# Patient Record
Sex: Male | Born: 1965 | Race: White | Hispanic: No | Marital: Single | Smoking: Never smoker
Health system: Southern US, Community
[De-identification: ages and names within clinical notes are randomized; demographics above are authoritative.]

## PROBLEM LIST (undated history)

## (undated) DIAGNOSIS — I10 Essential (primary) hypertension: Secondary | ICD-10-CM

## (undated) DIAGNOSIS — G8929 Other chronic pain: Secondary | ICD-10-CM

## (undated) DIAGNOSIS — E7800 Pure hypercholesterolemia, unspecified: Secondary | ICD-10-CM

## (undated) DIAGNOSIS — E119 Type 2 diabetes mellitus without complications: Secondary | ICD-10-CM

## (undated) DIAGNOSIS — M549 Dorsalgia, unspecified: Secondary | ICD-10-CM

## (undated) HISTORY — PX: OTHER SURGICAL HISTORY: SHX169

---

## 1998-12-27 ENCOUNTER — Ambulatory Visit (HOSPITAL_COMMUNITY): Admission: RE | Admit: 1998-12-27 | Discharge: 1998-12-27 | Payer: Self-pay | Admitting: Specialist

## 1999-04-02 ENCOUNTER — Encounter: Payer: Self-pay | Admitting: Physical Medicine and Rehabilitation

## 1999-04-02 ENCOUNTER — Ambulatory Visit (HOSPITAL_COMMUNITY)
Admission: RE | Admit: 1999-04-02 | Discharge: 1999-04-02 | Payer: Self-pay | Admitting: Physical Medicine and Rehabilitation

## 2001-12-15 ENCOUNTER — Emergency Department (HOSPITAL_COMMUNITY): Admission: EM | Admit: 2001-12-15 | Discharge: 2001-12-15 | Payer: Self-pay | Admitting: Emergency Medicine

## 2003-03-16 ENCOUNTER — Emergency Department (HOSPITAL_COMMUNITY): Admission: EM | Admit: 2003-03-16 | Discharge: 2003-03-16 | Payer: Self-pay | Admitting: Emergency Medicine

## 2003-03-16 ENCOUNTER — Encounter: Payer: Self-pay | Admitting: Emergency Medicine

## 2003-07-09 ENCOUNTER — Emergency Department (HOSPITAL_COMMUNITY): Admission: AD | Admit: 2003-07-09 | Discharge: 2003-07-09 | Payer: Self-pay | Admitting: Family Medicine

## 2004-02-07 ENCOUNTER — Emergency Department (HOSPITAL_COMMUNITY): Admission: EM | Admit: 2004-02-07 | Discharge: 2004-02-07 | Payer: Self-pay | Admitting: Emergency Medicine

## 2004-02-11 ENCOUNTER — Emergency Department (HOSPITAL_COMMUNITY): Admission: EM | Admit: 2004-02-11 | Discharge: 2004-02-11 | Payer: Self-pay | Admitting: Emergency Medicine

## 2004-04-13 ENCOUNTER — Emergency Department (HOSPITAL_COMMUNITY): Admission: EM | Admit: 2004-04-13 | Discharge: 2004-04-13 | Payer: Self-pay | Admitting: Emergency Medicine

## 2005-02-27 ENCOUNTER — Emergency Department (HOSPITAL_COMMUNITY): Admission: EM | Admit: 2005-02-27 | Discharge: 2005-02-27 | Payer: Self-pay | Admitting: Emergency Medicine

## 2005-08-22 ENCOUNTER — Emergency Department (HOSPITAL_COMMUNITY): Admission: EM | Admit: 2005-08-22 | Discharge: 2005-08-22 | Payer: Self-pay | Admitting: Emergency Medicine

## 2005-11-19 ENCOUNTER — Inpatient Hospital Stay (HOSPITAL_COMMUNITY): Admission: RE | Admit: 2005-11-19 | Discharge: 2005-11-21 | Payer: Self-pay | Admitting: Neurosurgery

## 2007-11-22 ENCOUNTER — Encounter: Admission: RE | Admit: 2007-11-22 | Discharge: 2007-11-22 | Payer: Self-pay | Admitting: Neurosurgery

## 2010-09-10 ENCOUNTER — Ambulatory Visit
Admission: RE | Admit: 2010-09-10 | Discharge: 2010-09-10 | Disposition: A | Discharge status: Home / Self Care | Payer: Medicare Other | Source: Ambulatory Visit | Attending: Family Medicine | Admitting: Family Medicine

## 2010-09-10 ENCOUNTER — Other Ambulatory Visit: Payer: Self-pay | Admitting: Family Medicine

## 2010-09-10 MED ORDER — IOHEXOL 300 MG/ML  SOLN
125.0000 mL | Freq: Once | INTRAMUSCULAR | Status: AC | PRN
  Administered 2010-09-10: 125 mL via INTRAVENOUS

## 2010-09-14 ENCOUNTER — Emergency Department (HOSPITAL_COMMUNITY)
Admission: EM | Admit: 2010-09-14 | Discharge: 2010-09-14 | Disposition: A | Discharge status: Home / Self Care | Payer: Medicare Other | Attending: Emergency Medicine | Admitting: Emergency Medicine

## 2010-09-14 DIAGNOSIS — R05 Cough: Secondary | ICD-10-CM | POA: Insufficient documentation

## 2010-09-14 DIAGNOSIS — Z79899 Other long term (current) drug therapy: Secondary | ICD-10-CM | POA: Insufficient documentation

## 2010-09-14 DIAGNOSIS — J3489 Other specified disorders of nose and nasal sinuses: Secondary | ICD-10-CM | POA: Insufficient documentation

## 2010-09-14 DIAGNOSIS — H9209 Otalgia, unspecified ear: Secondary | ICD-10-CM | POA: Insufficient documentation

## 2010-09-14 DIAGNOSIS — I10 Essential (primary) hypertension: Secondary | ICD-10-CM | POA: Insufficient documentation

## 2010-09-14 DIAGNOSIS — H6120 Impacted cerumen, unspecified ear: Secondary | ICD-10-CM | POA: Insufficient documentation

## 2010-09-14 DIAGNOSIS — E119 Type 2 diabetes mellitus without complications: Secondary | ICD-10-CM | POA: Insufficient documentation

## 2010-09-14 DIAGNOSIS — R059 Cough, unspecified: Secondary | ICD-10-CM | POA: Insufficient documentation

## 2010-09-14 DIAGNOSIS — H60399 Other infective otitis externa, unspecified ear: Secondary | ICD-10-CM | POA: Insufficient documentation

## 2010-12-19 NOTE — Op Note (Signed)
NAME:  Brian Parks, Brian Parks             ACCOUNT NO.:  [id number]   MEDICAL RECORD NO.:  [id number]          PATIENT TYPE:  INP   LOCATION:  3017                         FACILITY:  [facility]   PHYSICIAN:  Reinaldo Meeker, M.D. DATE OF BIRTH:  1966/03/24   DATE OF PROCEDURE:  11/19/2005  DATE OF DISCHARGE:                                 OPERATIVE REPORT   PREOPERATIVE DIAGNOSIS:  Herniated disk and degenerative disk disease, L5-  S1.   POSTOPERATIVE DIAGNOSIS:  Herniated disk and degenerative disk disease, L5-  S1.   PROCEDURE:  L5-S1 decompressive laminectomy followed by bilateral  microdiskectomy followed by posterior lumbar interbody fusion with a  NuVasive bony spacer and peak interbody cage followed by nonsegmental L5-S1  transfacet screw fixation followed by posterolateral fusion.   SECONDARY PROCEDURE:  Microdissection, L5-S1 disk and S1 nerve root and  intraoperative EMG monitoring.   SURGEON:  Reinaldo Meeker, M.D.   ASSISTANT:  Kathaleen Maser. Pool, M.D.   DESCRIPTION OF PROCEDURE:  After being placed in the prone position, the  patient's back was shaved, prepped, and draped in the usual sterile fashion.  Localizing fluoroscopy was used prior to incision to identify the  appropriate level.   A midline incision was made above the spinous processes of L4, L5, and S1.  Using Bovie cutting current, the incision was carried down to the spinous  processes.  Subperiosteal dissection was then carried out bilaterally on the  spinous process, lamina, and facet joint.  A self-retaining retractor was  placed for exposure.  Fluoroscopy showed approach at the appropriate level.  Using the Stille rongeur, the spinous process was removed and saved for use  later in the case along with the interspinous ligament.  At this time,  laminotomy was performed bilaterally by removing the inferior one-half of  the L5 lamina and the medial one-third of the dissection and the superior  half of the S1  lamina.  Bone was removed and saved for use later in the  case.  The ligamentum flavum was removed in a piecemeal fashion.  Similar  decompression was then carried out on the patient's right side.  When this  was completed, residual midline structures were removed to complete the  bilateral decompressive laminectomy.   At this time, bilateral microdiskectomy was carried out.  Starting on the  patient's right side, bipolar coagulation was used to identify the annulus  which was then incised and thoroughly cleaned out with pituitary rongeurs  and curettes.  A similar procedure was then carried out on the patient's  left and once again coagulating on the annulus, incising it, and thoroughly  cleaning out the disk space with pituitary rongeurs and curettes.  Great  care was taken to avoid injury to the neural elements, and this was  successfully done.   At this time, posterior lumbar interbody fusion was performed.  Starting on  the patient's right side, scraping was used.  The disk space had been  distracted up to a 10-mm size which was felt to be a good size.  Scraping  was carried out of the endplates followed by  chiseling with a 10 x 9-mm box  chisel.  When this was removed, any residual disk material was removed, and  then the NuVasive bony spacer of a 10 x 9 x 25-mm size was impacted in good  position which was confirmed with fluoroscopy.  A similar procedure was then  carried out on the opposite side; however, a peak interbody cage was used on  this side.  This was filled with autologous bone graft mixed with  Mastergraft putty.  Prior to placing the peak cage on the patient's left  side, autologous bone graft and BMP were placed deep within the interspace.  The peak spacer was then placed without difficulty, and fluoroscopy showed  it to be in good position.   At this time, transfacet screw fixation was carried out.  On the patient's  right side, a drill hole was used to give a  starting point.  A drill over a  guidewire was then passed through the inferior facet of L5 and into the  pedicle of S1.  This was followed under AP and lateral fluoroscopy as well  as direct palpation of the medial aspect of the pedicle.  Intraoperative EMG  monitoring was then used with NeuroVision to confirm no evidence of breech  of the pedicle and none was identified.  Tapping was then carried out  placement of a 35-mm screw.  A similar procedure was then carried out on the  patient's left, once again using a starting point and then following a drill  over the guidewire.  Once again, AP and lateral fluoroscopy as well as  direct palpation and intraoperative EMG monitoring were used.  Once again, a  35-mm screw was used after tapping had been done.  Final fluoroscopy in AP  and lateral plane showed the screws and bony spacers to all be in good  position.  Large amounts of irrigation were carried out.  Decortication was  carried out of the facet joints bilaterally, and BMP and Mastergraft and  bone were used to do a posterior facet fusion.  Gelfoam was then placed over  the dura and any bleeding controlled with bipolar coagulation and Gelfoam.  A Hemovac drain was used and brought through a separate stab wound incision  and left in the epidural space.  The wound was then closed with multiple  layers of Vicryl in the muscle, fascia, subcutaneous and subcuticular  tissues.  Staples were placed on the skin.  A sterile dressing was then  applied.   The patient was extubated and taken to the recovery room in stable  condition.           ______________________________  Reinaldo Meeker, M.D.     ROK/MEDQ  D:  11/19/2005  T:  11/20/2005  Job:  [id number]

## 2013-03-13 ENCOUNTER — Emergency Department (HOSPITAL_COMMUNITY): Payer: Medicare Other

## 2013-03-13 ENCOUNTER — Encounter (HOSPITAL_COMMUNITY): Payer: Self-pay | Admitting: *Deleted

## 2013-03-13 ENCOUNTER — Inpatient Hospital Stay (HOSPITAL_COMMUNITY)
Admission: EM | Admit: 2013-03-13 | Discharge: 2013-03-19 | DRG: 299 | Disposition: A | Discharge status: Home / Self Care | Payer: Medicare Other | Attending: Internal Medicine | Admitting: Internal Medicine

## 2013-03-13 ENCOUNTER — Ambulatory Visit
Admission: RE | Admit: 2013-03-13 | Discharge: 2013-03-13 | Disposition: A | Discharge status: Home / Self Care | Payer: Medicare Other | Source: Ambulatory Visit | Attending: Family Medicine | Admitting: Family Medicine

## 2013-03-13 ENCOUNTER — Other Ambulatory Visit: Payer: Self-pay | Admitting: Family Medicine

## 2013-03-13 DIAGNOSIS — D649 Anemia, unspecified: Secondary | ICD-10-CM

## 2013-03-13 DIAGNOSIS — I2692 Saddle embolus of pulmonary artery without acute cor pulmonale: Secondary | ICD-10-CM | POA: Diagnosis present

## 2013-03-13 DIAGNOSIS — F1120 Opioid dependence, uncomplicated: Secondary | ICD-10-CM

## 2013-03-13 DIAGNOSIS — E871 Hypo-osmolality and hyponatremia: Secondary | ICD-10-CM | POA: Diagnosis not present

## 2013-03-13 DIAGNOSIS — I2699 Other pulmonary embolism without acute cor pulmonale: Secondary | ICD-10-CM

## 2013-03-13 DIAGNOSIS — K5900 Constipation, unspecified: Secondary | ICD-10-CM

## 2013-03-13 DIAGNOSIS — R0609 Other forms of dyspnea: Secondary | ICD-10-CM | POA: Diagnosis present

## 2013-03-13 DIAGNOSIS — I82401 Acute embolism and thrombosis of unspecified deep veins of right lower extremity: Secondary | ICD-10-CM

## 2013-03-13 DIAGNOSIS — Z794 Long term (current) use of insulin: Secondary | ICD-10-CM

## 2013-03-13 DIAGNOSIS — E119 Type 2 diabetes mellitus without complications: Secondary | ICD-10-CM | POA: Diagnosis present

## 2013-03-13 DIAGNOSIS — R609 Edema, unspecified: Secondary | ICD-10-CM

## 2013-03-13 DIAGNOSIS — I2789 Other specified pulmonary heart diseases: Secondary | ICD-10-CM | POA: Diagnosis present

## 2013-03-13 DIAGNOSIS — F1920 Other psychoactive substance dependence, uncomplicated: Secondary | ICD-10-CM | POA: Diagnosis present

## 2013-03-13 DIAGNOSIS — I82402 Acute embolism and thrombosis of unspecified deep veins of left lower extremity: Secondary | ICD-10-CM

## 2013-03-13 DIAGNOSIS — G8929 Other chronic pain: Secondary | ICD-10-CM | POA: Diagnosis present

## 2013-03-13 DIAGNOSIS — I10 Essential (primary) hypertension: Secondary | ICD-10-CM | POA: Diagnosis present

## 2013-03-13 DIAGNOSIS — IMO0001 Reserved for inherently not codable concepts without codable children: Secondary | ICD-10-CM

## 2013-03-13 DIAGNOSIS — M549 Dorsalgia, unspecified: Secondary | ICD-10-CM | POA: Diagnosis present

## 2013-03-13 DIAGNOSIS — I82409 Acute embolism and thrombosis of unspecified deep veins of unspecified lower extremity: Principal | ICD-10-CM

## 2013-03-13 DIAGNOSIS — IMO0002 Reserved for concepts with insufficient information to code with codable children: Secondary | ICD-10-CM

## 2013-03-13 DIAGNOSIS — R0989 Other specified symptoms and signs involving the circulatory and respiratory systems: Secondary | ICD-10-CM | POA: Diagnosis present

## 2013-03-13 DIAGNOSIS — E118 Type 2 diabetes mellitus with unspecified complications: Secondary | ICD-10-CM

## 2013-03-13 DIAGNOSIS — R000 Tachycardia, unspecified: Secondary | ICD-10-CM | POA: Diagnosis present

## 2013-03-13 DIAGNOSIS — E1165 Type 2 diabetes mellitus with hyperglycemia: Secondary | ICD-10-CM

## 2013-03-13 DIAGNOSIS — D696 Thrombocytopenia, unspecified: Secondary | ICD-10-CM | POA: Diagnosis present

## 2013-03-13 DIAGNOSIS — E7800 Pure hypercholesterolemia, unspecified: Secondary | ICD-10-CM | POA: Diagnosis present

## 2013-03-13 HISTORY — DX: Pure hypercholesterolemia, unspecified: E78.00

## 2013-03-13 HISTORY — DX: Essential (primary) hypertension: I10

## 2013-03-13 HISTORY — DX: Other chronic pain: G89.29

## 2013-03-13 HISTORY — DX: Type 2 diabetes mellitus without complications: E11.9

## 2013-03-13 HISTORY — DX: Dorsalgia, unspecified: M54.9

## 2013-03-13 LAB — POCT I-STAT, CHEM 8
Hemoglobin: 15 g/dL (ref 13.0–17.0)
Sodium: 136 mEq/L (ref 135–145)
TCO2: 19 mmol/L (ref 0–100)

## 2013-03-13 LAB — GLUCOSE, CAPILLARY: Glucose-Capillary: 182 mg/dL — ABNORMAL HIGH (ref 70–99)

## 2013-03-13 LAB — ANTITHROMBIN III: AntiThromb III Func: 106 % (ref 75–120)

## 2013-03-13 LAB — HOMOCYSTEINE: Homocysteine: 15.4 umol/L (ref 4.0–15.4)

## 2013-03-13 LAB — CBC
Platelets: 129 10*3/uL — ABNORMAL LOW (ref 150–400)
RDW: 13.3 % (ref 11.5–15.5)
WBC: 9.6 10*3/uL (ref 4.0–10.5)

## 2013-03-13 LAB — PROTIME-INR: INR: 1.11 (ref 0.00–1.49)

## 2013-03-13 LAB — MRSA PCR SCREENING: MRSA by PCR: NEGATIVE

## 2013-03-13 MED ORDER — IOHEXOL 350 MG/ML SOLN
100.0000 mL | Freq: Once | INTRAVENOUS | Status: AC | PRN
  Administered 2013-03-13: 100 mL via INTRAVENOUS

## 2013-03-13 MED ORDER — OXYCODONE HCL ER 40 MG PO T12A
40.0000 mg | EXTENDED_RELEASE_TABLET | Freq: Two times a day (BID) | ORAL | Status: DC
  Administered 2013-03-13 – 2013-03-19 (×12): 40 mg via ORAL
  Filled 2013-03-13 (×12): qty 1

## 2013-03-13 MED ORDER — SODIUM CHLORIDE 0.9 % IV SOLN: 250.0000 mL | INTRAVENOUS | Status: DC | PRN

## 2013-03-13 MED ORDER — INSULIN ASPART 100 UNIT/ML ~~LOC~~ SOLN
0.0000 [IU] | SUBCUTANEOUS | Status: DC
  Administered 2013-03-13: 7 [IU] via SUBCUTANEOUS
  Administered 2013-03-13: 4 [IU] via SUBCUTANEOUS
  Administered 2013-03-14: 7 [IU] via SUBCUTANEOUS
  Administered 2013-03-14 (×3): 4 [IU] via SUBCUTANEOUS
  Administered 2013-03-14 (×3): 7 [IU] via SUBCUTANEOUS
  Administered 2013-03-15: 4 [IU] via SUBCUTANEOUS
  Administered 2013-03-15: 3 [IU] via SUBCUTANEOUS
  Administered 2013-03-15: 4 [IU] via SUBCUTANEOUS
  Administered 2013-03-15 (×2): 7 [IU] via SUBCUTANEOUS
  Administered 2013-03-16: 4 [IU] via SUBCUTANEOUS
  Administered 2013-03-16: 7 [IU] via SUBCUTANEOUS
  Filled 2013-03-13: qty 1

## 2013-03-13 MED ORDER — ASPIRIN 81 MG PO CHEW
324.0000 mg | CHEWABLE_TABLET | ORAL | Status: AC
  Administered 2013-03-13: 324 mg via ORAL
  Filled 2013-03-13: qty 4

## 2013-03-13 MED ORDER — HEPARIN BOLUS VIA INFUSION
5700.0000 [IU] | Freq: Once | INTRAVENOUS | Status: AC
  Administered 2013-03-13: 5700 [IU] via INTRAVENOUS

## 2013-03-13 MED ORDER — FENOFIBRATE 160 MG PO TABS
160.0000 mg | ORAL_TABLET | Freq: Every day | ORAL | Status: DC
  Administered 2013-03-13 – 2013-03-18 (×6): 160 mg via ORAL
  Filled 2013-03-13 (×8): qty 1

## 2013-03-13 MED ORDER — ASPIRIN 300 MG RE SUPP: 300.0000 mg | RECTAL | Status: AC

## 2013-03-13 MED ORDER — SODIUM CHLORIDE 0.9 % IV SOLN
INTRAVENOUS | Status: DC
  Administered 2013-03-13 – 2013-03-15 (×4): via INTRAVENOUS

## 2013-03-13 MED ORDER — SODIUM CHLORIDE 0.9 % IV SOLN
Freq: Once | INTRAVENOUS | Status: AC
  Administered 2013-03-13: 17:00:00 via INTRAVENOUS

## 2013-03-13 MED ORDER — HEPARIN (PORCINE) IN NACL 100-0.45 UNIT/ML-% IJ SOLN
2000.0000 [IU]/h | INTRAMUSCULAR | Status: DC
  Administered 2013-03-13: 1800 [IU]/h via INTRAVENOUS
  Administered 2013-03-14: 2000 [IU]/h via INTRAVENOUS
  Filled 2013-03-13 (×4): qty 250

## 2013-03-13 MED ORDER — COLESEVELAM HCL 625 MG PO TABS
1875.0000 mg | ORAL_TABLET | Freq: Two times a day (BID) | ORAL | Status: DC
  Administered 2013-03-14 – 2013-03-19 (×11): 1875 mg via ORAL
  Filled 2013-03-13 (×13): qty 3

## 2013-03-13 NOTE — ED Provider Notes (Signed)
Medical screening examination/treatment/procedure(s) were performed by non-physician practitioner and as supervising physician I was immediately available for consultation/collaboration.  Juliet Rude. Rubin Payor, MD 03/13/13 1616

## 2013-03-13 NOTE — ED Notes (Signed)
Attempt to call report x 1  

## 2013-03-13 NOTE — ED Notes (Signed)
Pt eating dinner

## 2013-03-13 NOTE — ED Notes (Signed)
Meal tray ordered for patient.

## 2013-03-13 NOTE — Progress Notes (Signed)
ANTICOAGULATION CONSULT NOTE - Initial Consult  Pharmacy Consult for Heparin Indication: pulmonary embolus and DVT  Allergies  Allergen Reactions  . Lipitor (Atorvastatin) Other (See Comments)    Fatigue  . Losartan Nausea And Vomiting, Other (See Comments) and Cough    Dizziness   . Lyrica (Pregabalin) Other (See Comments)    blindness  . Prednisone Swelling    Legs swell    Patient Measurements: 76 in Weight: 275 lb (124.739 kg) Heparin Dosing Weight: 113 kg  Vital Signs: Temp: 98.6 F (37 C) (08/11 1350) Temp src: Oral (08/11 1350) BP: 140/90 mmHg (08/11 1515) Pulse Rate: 106 (08/11 1515)  Labs:  Recent Labs  03/13/13 1340 03/13/13 1409  HGB 14.6 15.0  HCT 40.8 44.0  PLT 129*  --   LABPROT 14.1  --   INR 1.11  --   CREATININE  --  1.10    CrCl is unknown because there is no height on file for the current visit.   Medical History: Past Medical History  Diagnosis Date  . Hypertension   . Hypercholesteremia   . Diabetes mellitus without complication   . Chronic back pain     Assessment: 47 y/o M with + outpatient doppler for DVT who comes to ED today for CT angio which is + for saddle PE. To start heparin per pharmacy. No bleeding issues per patient. Renal function good, H/H good, PLTS 129, baseline INR is 1.11, aPTT is ordered and lab is in room drawing.   Goal of Therapy:  Heparin level 0.3-0.7 units/ml Monitor platelets by anticoagulation protocol: Yes   Plan:  -Heparin 5700 units BOLUS x 1 -Start heparin infusion at 1800 units/hr -Check 6 hour HL at 2300 -Daily CBC/HL -Watch for bleeding  Thank you for allowing me to take part in this patient's care,  Abran Duke, PharmD Clinical Pharmacist Phone: [phone] Pager: [phone] 03/13/2013 4:45 PM

## 2013-03-13 NOTE — ED Provider Notes (Signed)
CSN: [id number]     Arrival date & time 03/13/13  1310 History     First MD Initiated Contact with Patient 03/13/13 1340     No chief complaint on file.  (Consider location/radiation/quality/duration/timing/severity/associated sxs/prior Treatment) HPI Comments: Patient is a 47 oral melena history of hypertension, hypercholesterolemia, and diabetes mellitus who presents after having venous duplex of LLE positive for DVT. Patient states the symptoms began one week ago as dyspnea on exertion. He states that he began to get winded, even walking to his mailbox. And dyspnea on exertion resolved over the course of a few days; however was followed by dry, nonproductive cough, lightheadedness, and near syncope as well as intermittent claudication which began 2 days ago and swelling of his LLE with onset yesterday. Patient went to see PCP, Dr. Suann Larry, this AM who ordered outpatient venous duplex. Findings significant for thrombus beginning at the level of the mid superficial femoral vein and extending distally through the popliteal vein into the posterior tibial vein. Patient denies fever, hemoptysis, CP, numbness/tingling, and extremity weakness. Denies hx of recent surgeries or hospitalizations as well as coagulation disorders.  The history is provided by the patient. No language interpreter was used.    Past Medical History  Diagnosis Date  . Hypertension   . Hypercholesteremia   . Diabetes mellitus without complication   . Chronic back pain    Past Surgical History  Procedure Laterality Date  . Nerve stimilator     No family history on file. History  Substance Use Topics  . Smoking status: Never Smoker   . Smokeless tobacco: Not on file  . Alcohol Use: No    Review of Systems  Constitutional: Negative for fever.  Eyes: Negative for visual disturbance.  Respiratory: Positive for cough (dry, nonproductive) and shortness of breath.   Cardiovascular: Positive for leg swelling.   Gastrointestinal: Positive for nausea and vomiting (NB/NB).  Skin: Negative for color change and pallor.  Neurological: Positive for light-headedness. Negative for syncope and weakness.  All other systems reviewed and are negative.   Allergies  Lipitor; Losartan; Lyrica; and Prednisone  Home Medications   Current Outpatient Rx  Name  Route  Sig  Dispense  Refill  . colesevelam (WELCHOL) 625 MG tablet   Oral   Take 1,875 mg by mouth 2 (two) times daily with a meal.         . fenofibrate 160 MG tablet   Oral   Take 160 mg by mouth at bedtime.         Marland Kitchen glimepiride (AMARYL) 2 MG tablet   Oral   Take 2 mg by mouth 2 (two) times daily.         . insulin lispro protamine-lispro (HUMALOG 50/50) (50-50) 100 UNIT/ML SUSP injection   Subcutaneous   Inject 20 Units into the skin 3 (three) times daily.         Marland Kitchen linagliptin (TRADJENTA) 5 MG TABS tablet   Oral   Take 5 mg by mouth daily.         Marland Kitchen losartan (COZAAR) 100 MG tablet   Oral   Take 100 mg by mouth daily.         . metFORMIN (GLUCOPHAGE) 1000 MG tablet   Oral   Take 1,000 mg by mouth 2 (two) times daily with a meal.         . OMEGA-3 KRILL OIL PO   Oral   Take 1 capsule by mouth every morning.         Marland Kitchen  oxyCODONE (OXYCONTIN) 40 MG 12 hr tablet   Oral   Take 40 mg by mouth every 8 (eight) hours.          BP 147/86  Pulse 109  Temp(Src) 98.6 F (37 C) (Oral)  Resp 17  SpO2 96%  Physical Exam  Constitutional: He is oriented to person, place, and time. He appears well-developed and well-nourished. No distress.  HENT:  Head: Normocephalic and atraumatic.  Mouth/Throat: Oropharynx is clear and moist.  Eyes: Conjunctivae and EOM are normal. Pupils are equal, round, and reactive to light. No scleral icterus.  Neck: Normal range of motion. Neck supple.  Cardiovascular: Normal rate, regular rhythm, normal heart sounds and intact distal pulses.   Tachycardic to 110. 2+ DP and PT pulses b/l   Pulmonary/Chest: Effort normal and breath sounds normal. No respiratory distress. He has no wheezes. He has no rales.  No dyspnea, tachypnea, or accessory muscle use  Abdominal: Soft. He exhibits no distension. There is no tenderness.  Musculoskeletal:  +swelling of L foot and ankle  Neurological: He is alert and oriented to person, place, and time.  Speaks in full, goal oriented sentences. Moves extremities without ataxia  Skin: Skin is warm and dry. No rash noted. He is not diaphoretic. No erythema. No pallor.  Psychiatric: He has a normal mood and affect. His behavior is normal.   ED Course   Procedures (including critical care time)  Labs Reviewed  CBC - Abnormal; Notable for the following:    Platelets 129 (*)    All other components within normal limits  POCT I-STAT, CHEM 8 - Abnormal; Notable for the following:    Glucose, Bld 292 (*)    All other components within normal limits  PROTIME-INR  ANTITHROMBIN III  PROTEIN C ACTIVITY  PROTEIN C, TOTAL  PROTEIN S ACTIVITY  PROTEIN S, TOTAL  LUPUS ANTICOAGULANT PANEL  BETA-2-GLYCOPROTEIN I ABS, IGG/M/A  HOMOCYSTEINE  FACTOR 5 LEIDEN  PROTHROMBIN GENE MUTATION  CARDIOLIPIN ANTIBODIES, IGG, IGM, IGA   US Venous Img Lower Unilateral Left  03/13/2013   *RADIOLOGY REPORT*  Clinical Data: But the left lower extremity swelling and pain  BILATERAL LOWER EXTREMITY VENOUS DUPLEX ULTRASOUND  Technique:  Gray-scale sonography with graded compression, as well as color Doppler and duplex ultrasound, were performed to evaluate the deep venous system of both lower extremities from the level of the common femoral vein through the popliteal and proximal calf veins.  Spectral Doppler was utilized to evaluate flow at rest and with distal augmentation maneuvers.  Comparison:  None.  Findings:  There is a visualized thrombus beginning at the level of the mid superficial femoral vein and extending distally through the popliteal vein into the posterior  tibial vein.  These portions of the deep venous system show absence of a normal phasicity and compressibility.  Common femoral vein, saphenous femoral junction, profunda femoral vein, and more proximal superficial femoral vein appear normal.  IMPRESSION: There is evidence of acute deep venous thrombosis in the left lower extremity as described above.I gave this report to Dr. Chinita Greenland by phone at  12:46.   Original Report Authenticated By: Esperanza Heir, M.D.    Date: 03/13/2013  Rate: 109  Rhythm: sinus tachycardia  QRS Axis: normal  Intervals: normal  ST/T Wave abnormalities: normal  Conduction Disutrbances:none  Narrative Interpretation: Sinus tachycardia; no STEMI or ischemic changes  Old EKG Reviewed: none available I have personally reviewed and interpreted this EKG  No diagnosis found.  MDM  Patient  with positive venous duplex today, done as outpatient. Here for further work up in light of recent DOE, lightheadedness, and near syncope a few days ago. Patient tachycardic on arrival to 110. No tachypnea, dyspnea, or hypoxia; lungs CTAB. Hypercoagulable panel ordered as patient does not have hx of coagulopathies. PT/INR normal. CBC significant for mild thrombocytopenia of 129. Kidney function normal. CT angio chest ordered to evaluate for PE.  3:45 - CT in process. Patient signed out to oncoming provider, Dr. Alvira Monday, for further evaluation and dispo.  Filed Vitals:   03/13/13 1314 03/13/13 1350 03/13/13 1500 03/13/13 1515  BP: 155/102 147/86 135/60 140/90  Pulse: 111 109 107 106  Temp: 98.2 F (36.8 C) 98.6 F (37 C)    TempSrc: Oral Oral    Resp: 18 17 18 18   SpO2: 98% 96% 92% 95%      Antony Madura, PA-C 03/13/13 1551

## 2013-03-13 NOTE — H&P (Signed)
PULMONARY  / CRITICAL CARE MEDICINE  Name: Brian Parks MRN: [id number] DOB: 1966-03-24    ADMISSION DATE:  03/13/2013  REFERRING MD :  EDP PRIMARY SERVICE: PCCM  CHIEF COMPLAINT:  PE  BRIEF PATIENT DESCRIPTION: 47 y/o M who presented to [facility] ER 8/11 with known positive DVT per PCP.  CT eval with large saddle emboli.    SIGNIFICANT EVENTS / STUDIES:  outpt duplex pos left lower exr 8/11 - admit with PE / DVT  LINES / TUBES:  CULTURES:  ANTIBIOTICS:  HISTORY OF PRESENT ILLNESS:  47 y/o M with PMH of HTN, HLD, DM, chronic back pain s/p nerve stimulator who presented to [facility] ER 8/11 from PCP (Dr. Suann Larry) with positive LE DVT - reported LE swelling and claudication.  Patient notes he has been having 1 wk hx of dyspnea on exertion that was initially intermittent.  Notes dry cough, lightheadedness, and near syncope.  Denies fever, chills, chest pain, chest pain with inspiration, hemoptysis. Evaluation in ER demonstrated CT with large saddle emboli.  Reports mother with history of lupus.   PAST MEDICAL HISTORY :  Past Medical History  Diagnosis Date  . Hypertension   . Hypercholesteremia   . Diabetes mellitus without complication   . Chronic back pain    Past Surgical History  Procedure Laterality Date  . Nerve stimilator     Prior to Admission medications   Medication Sig Start Date End Date Taking? Authorizing Provider  colesevelam (WELCHOL) 625 MG tablet Take 1,875 mg by mouth 2 (two) times daily with a meal.   Yes Historical Provider, MD  fenofibrate 160 MG tablet Take 160 mg by mouth at bedtime.   Yes Historical Provider, MD  glimepiride (AMARYL) 2 MG tablet Take 2 mg by mouth 2 (two) times daily.   Yes Historical Provider, MD  insulin lispro protamine-lispro (HUMALOG 50/50) (50-50) 100 UNIT/ML SUSP injection Inject 20 Units into the skin 3 (three) times daily.   Yes Historical Provider, MD  linagliptin (TRADJENTA) 5 MG TABS tablet Take 5 mg by mouth daily.   Yes Historical  Provider, MD  losartan (COZAAR) 100 MG tablet Take 100 mg by mouth daily.   Yes Historical Provider, MD  metFORMIN (GLUCOPHAGE) 1000 MG tablet Take 1,000 mg by mouth 2 (two) times daily with a meal.   Yes Historical Provider, MD  OMEGA-3 KRILL OIL PO Take 1 capsule by mouth every morning.   Yes Historical Provider, MD  oxyCODONE (OXYCONTIN) 40 MG 12 hr tablet Take 40 mg by mouth every 8 (eight) hours.   Yes Historical Provider, MD   Allergies  Allergen Reactions  . Lipitor (Atorvastatin) Other (See Comments)    Fatigue  . Losartan Nausea And Vomiting, Other (See Comments) and Cough    Dizziness   . Lyrica (Pregabalin) Other (See Comments)    blindness  . Prednisone Swelling    Legs swell    FAMILY HISTORY:  Mom - lupus, dvt, unknown age Brother- high chol, dm, htn  SOCIAL HISTORY:  reports that he has never smoked. He does not have any smokeless tobacco history on file. He reports that he does not drink alcohol or use illicit drugs.  REVIEW OF SYSTEMS:   Gen: Denies fever, chills, weight change, fatigue, night sweats HEENT: Denies blurred vision, double vision, hearing loss, tinnitus, sinus congestion, rhinorrhea, sore throat, neck stiffness, dysphagia PULM: Denies sputum production, hemoptysis, wheezing.  SEE HPI.   CV: Denies chest pain, edema, orthopnea, paroxysmal nocturnal dyspnea, palpitations GI:  Denies abdominal pain, nausea, vomiting, diarrhea, hematochezia, melena, constipation, change in bowel habits GU: Denies dysuria, hematuria, polyuria, oliguria, urethral discharge Endocrine: Denies hot or cold intolerance, polyuria, polyphagia or appetite change Derm: Denies rash, dry skin, scaling or peeling skin change Heme: Denies easy bruising, bleeding, bleeding gums Neuro: Denies headache, numbness, weakness, slurred speech, loss of memory or consciousness   SUBJECTIVE:   VITAL SIGNS: Temp:  [98.2 F (36.8 C)-98.6 F (37 C)] 98.6 F (37 C) (08/11 1350) Pulse Rate:   [106-111] 106 (08/11 1515) Resp:  [17-18] 18 (08/11 1515) BP: (135-155)/(60-102) 140/90 mmHg (08/11 1515) SpO2:  [92 %-98 %] 95 % (08/11 1515) Weight:  [275 lb (124.739 kg)] 275 lb (124.739 kg) (08/11 1632)  INTAKE / OUTPUT: Intake/Output   None     PHYSICAL EXAMINATION: General:  No distress Neuro:  Awake,nonfocal HEENT:  jvd wnl Cardiovascular:  s1 s2 rrr no r/g Lungs:  CTA Abdomen:  Soft, obee, NT, nd no r /g Musculoskeletal:  Edema, dense all left lower ext   LABS:  CBC Recent Labs     03/13/13  1340  03/13/13  1409  WBC  9.6   --   HGB  14.6  15.0  HCT  40.8  44.0  PLT  129*   --    Coag's Recent Labs     03/13/13  1340  INR  1.11   BMET Recent Labs     03/13/13  1409  NA  136  K  3.9  CL  104  BUN  18  CREATININE  1.10  GLUCOSE  292*   Electrolytes No results found for this basename: CALCIUM, MG, PHOS,  in the last 72 hours Sepsis Markers No results found for this basename: LACTICACIDVEN, PROCALCITON, O2SATVEN,  in the last 72 hours ABG No results found for this basename: PHART, PCO2ART, PO2ART,  in the last 72 hours Liver Enzymes No results found for this basename: AST, ALT, ALKPHOS, BILITOT, ALBUMIN,  in the last 72 hours Cardiac Enzymes No results found for this basename: TROPONINI, PROBNP,  in the last 72 hours Glucose No results found for this basename: GLUCAP,  in the last 72 hours  Imaging Ct Angio Chest W/cm &/or Wo Cm  03/13/2013   *RADIOLOGY REPORT*  Clinical Data: History of lower extremity blood clot, now with shortness of breath, dizziness and cough, evaluate for pulmonary embolism  CT ANGIOGRAPHY CHEST  Technique:  Multidetector CT imaging of the chest using the standard protocol during bolus administration of intravenous contrast. Multiplanar reconstructed images including MIPs were obtained and reviewed to evaluate the vascular anatomy.  Contrast: OMNIPAQUE IOHEXOL 350 MG/ML SOLN  Comparison: Bilateral lower extremity venous  Doppler ultrasound - earlier same day  Vascular Findings:  There is adequate opacification of the pulmonary arterial system of the main pulmonary artery measuring 299 HU.  There is saddle pulmonary emboli noted extending across the bifurcation of the main pulmonary artery with nonocclusive thrombus seen within both the right and left pulmonary arteries.  There is extension of mixed occlusive and nonocclusive thrombus into nearly all bilateral segmental pulmonary arteries, likely worse within the bilateral lower lobar pulmonary arteries, left greater than right.  There is mild enlargement the caliber the main pulmonary artery measuring approximately 36 mm in diameter.  There is no definitive evidence of right-sided heart strain.  There is no reflux of administered intravenous contrast into the hepatic venous system. There is no definitive evidence of pulmonary infarction.  Normal heart size.  Coronary artery calcifications.  No pericardial effusion.  Normal caliber of the thoracic aorta.  Bovine configuration of the aortic arch.  No thoracic aortic dissection or periaortic stranding.  -------------------------------------------------------  Nonvascular findings:  Minimal bibasilar dependent ground-glass atelectasis.  No focal airspace opacities.  No definitive evidence of pulmonary infarction.  The central pulmonary airways are patent.  No pleural effusion or pneumothorax.  Scattered shoddy mediastinal lymph nodes are not enlarged by CT criteria.  No mediastinal, hilar or axillary lymphadenopathy.  Limited visualization of the upper abdomen suggests possible decreased attenuation of the hepatic parenchyma suggestive of hepatic steatosis.  No acute or aggressive osseous abnormalities.  A spinal stimulator is seen within the spinal canal of the lower thoracic spine.  IMPRESSION: 1.  Examination is positive for saddle pulmonary embolism with large bilateral occlusive or nonocclusive clot burden.  No definitive evidence  of pulmonary infarction.  While there is enlargement of the caliber of the main pulmonary artery, there is no definite CT evidence of right-sided heart strain.  Further evaluation with cardiac echo may be performed as clinically indicated. 2.  Possible hepatic steatosis.  Above findings discussed with Dr. Rubin Payor at [phone].   Original Report Authenticated By: Tacey Ruiz, MD   US Venous Img Lower Unilateral Left  03/13/2013   *RADIOLOGY REPORT*  Clinical Data: But the left lower extremity swelling and pain  BILATERAL LOWER EXTREMITY VENOUS DUPLEX ULTRASOUND  Technique:  Gray-scale sonography with graded compression, as well as color Doppler and duplex ultrasound, were performed to evaluate the deep venous system of both lower extremities from the level of the common femoral vein through the popliteal and proximal calf veins.  Spectral Doppler was utilized to evaluate flow at rest and with distal augmentation maneuvers.  Comparison:  None.  Findings:  There is a visualized thrombus beginning at the level of the mid superficial femoral vein and extending distally through the popliteal vein into the posterior tibial vein.  These portions of the deep venous system show absence of a normal phasicity and compressibility.  Common femoral vein, saphenous femoral junction, profunda femoral vein, and more proximal superficial femoral vein appear normal.  IMPRESSION: There is evidence of acute deep venous thrombosis in the left lower extremity as described above.I gave this report to Dr. Chinita Greenland by phone at  12:46.   Original Report Authenticated By: Esperanza Heir, M.D.    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) Dyspnea  P:   -stat heparin gtt STAT, aggressive bolus -Would NOT use lovenox -no coumadin etc for now until ensure no tpa needed -assess hypercoag panel prior to heparin. Has pos fam h/o -pcxr in am  -no role filter at this stage -echo  CARDIOVASCULAR A:   Tachycardia - in setting of PE P:  -ICU montior -gentle hydration -continue welchol, fenofibrate -echo. Assess pa htn , likely as he appears so good clinicaly -see heme -need homocysteine level  RENAL A:   P:   -monitor lytes / sr cr  GASTROINTESTINAL A:   P:   -heart healthy, carb modified diet  HEMATOLOGIC A:   PE / DVT P:  -hypercoag panel as above -heparin gtt -keep in ICU until ensure no tpa needed, declines -no ambulation today -need lups anticoag, ana, esr, homocystein, pt 202010 gene mutation as under age 35 still  INFECTIOUS A:  No acute infectious process P:   -monitor fever curve / leukocytosis  ENDOCRINE A:   DM   P:   -  SSI -hold amaryl,  tradjenta, metformin, humalog 50/50  NEUROLOGIC A:   Chronic Back Pain P:   -continue oxy 40 mg Q12 (reduced from home Q8 dosing)   I have personally obtained a history, examined the patient, evaluated laboratory and imaging results, formulated the assessment and plan and placed orders.  CRITICAL CARE: The patient is critically ill with multiple organ systems failure and requires high complexity decision making for assessment and support, frequent evaluation and titration of therapies, application of advanced monitoring technologies and extensive interpretation of multiple databases. Critical Care Time devoted to patient care services described in this note is 30 minutes.    Mcarthur Rossetti. Tyson Alias, MD, FACP Pgr: [phone] [place] [facility]  03/13/2013, 4:43 PM

## 2013-03-13 NOTE — ED Notes (Signed)
PT sent here by pcp for CT to r/o PE after being dx with dvt to L leg on Sunday.  Denies cough and sob at this time, though did experience sob/dizziness last week. PT's pcp is Dr Suann Larry [phone].

## 2013-03-14 ENCOUNTER — Inpatient Hospital Stay (HOSPITAL_COMMUNITY): Payer: Medicare Other

## 2013-03-14 ENCOUNTER — Encounter (HOSPITAL_COMMUNITY): Payer: Self-pay | Admitting: *Deleted

## 2013-03-14 DIAGNOSIS — I369 Nonrheumatic tricuspid valve disorder, unspecified: Secondary | ICD-10-CM

## 2013-03-14 LAB — LUPUS ANTICOAGULANT PANEL
DRVVT: 40 secs (ref <42.9)
PTT Lupus Anticoagulant: 35.6 secs (ref 28.0–43.0)

## 2013-03-14 LAB — HEPARIN LEVEL (UNFRACTIONATED): Heparin Unfractionated: 0.19 IU/mL — ABNORMAL LOW (ref 0.30–0.70)

## 2013-03-14 LAB — BASIC METABOLIC PANEL
BUN: 14 mg/dL (ref 6–23)
Calcium: 8.8 mg/dL (ref 8.4–10.5)
Creatinine, Ser: 0.95 mg/dL (ref 0.50–1.35)
GFR calc Af Amer: >90 mL/min (ref >90)
GFR calc non Af Amer: >90 mL/min (ref >90)
Potassium: 3.9 mEq/L (ref 3.5–5.1)

## 2013-03-14 LAB — CBC
Hemoglobin: 12.7 g/dL — ABNORMAL LOW (ref 13.0–17.0)
MCH: 29.9 pg (ref 26.0–34.0)
MCHC: 34.5 g/dL (ref 30.0–36.0)
Platelets: 124 10*3/uL — ABNORMAL LOW (ref 150–400)
RDW: 13.4 % (ref 11.5–15.5)

## 2013-03-14 LAB — GLUCOSE, CAPILLARY
Glucose-Capillary: 174 mg/dL — ABNORMAL HIGH (ref 70–99)
Glucose-Capillary: 204 mg/dL — ABNORMAL HIGH (ref 70–99)

## 2013-03-14 LAB — FACTOR 5 LEIDEN

## 2013-03-14 LAB — PROTEIN S ACTIVITY: Protein S Activity: 137 % — ABNORMAL HIGH (ref 69–129)

## 2013-03-14 LAB — PROTHROMBIN GENE MUTATION

## 2013-03-14 LAB — PROTEIN C ACTIVITY: Protein C Activity: 200 % — ABNORMAL HIGH (ref 75–133)

## 2013-03-14 MED ORDER — HEPARIN (PORCINE) IN NACL 100-0.45 UNIT/ML-% IJ SOLN
2200.0000 [IU]/h | INTRAMUSCULAR | Status: DC
  Filled 2013-03-14 (×2): qty 250

## 2013-03-14 MED ORDER — HEPARIN BOLUS VIA INFUSION
7500.0000 [IU] | Freq: Once | INTRAVENOUS | Status: AC
  Administered 2013-03-14: 7500 [IU] via INTRAVENOUS
  Filled 2013-03-14: qty 7500

## 2013-03-14 MED ORDER — HEPARIN BOLUS VIA INFUSION
2000.0000 [IU] | Freq: Once | INTRAVENOUS | Status: AC
  Administered 2013-03-14: 2000 [IU] via INTRAVENOUS
  Filled 2013-03-14: qty 2000

## 2013-03-14 MED ORDER — HEPARIN BOLUS VIA INFUSION
2500.0000 [IU] | Freq: Once | INTRAVENOUS | Status: AC
  Administered 2013-03-14: 2500 [IU] via INTRAVENOUS
  Filled 2013-03-14: qty 2500

## 2013-03-14 MED ORDER — HEPARIN BOLUS VIA INFUSION
6000.0000 [IU] | Freq: Once | INTRAVENOUS | Status: AC
  Administered 2013-03-14: 6000 [IU] via INTRAVENOUS
  Filled 2013-03-14: qty 6000

## 2013-03-14 MED ORDER — HEPARIN (PORCINE) IN NACL 100-0.45 UNIT/ML-% IJ SOLN
2600.0000 [IU]/h | INTRAMUSCULAR | Status: DC
  Administered 2013-03-14 – 2013-03-15 (×3): 2600 [IU]/h via INTRAVENOUS
  Filled 2013-03-14 (×5): qty 250

## 2013-03-14 NOTE — Progress Notes (Signed)
Inpatient Diabetes Program Recommendations  AACE/ADA: New Consensus Statement on Inpatient Glycemic Control (2013)  Target Ranges:  Prepandial:   less than 140 mg/dL      Peak postprandial:   less than 180 mg/dL (1-2 hours)      Critically ill patients:  140 - 180 mg/dL   Reason for Assessmentt: Hyperglycemia  Inpatient Diabetes Program Recommendations Insulin - Basal: On no basal insulin Correction (SSI): Receiving resistant scale novolog correction every 4 hours HgbA1C: No A1C noted. Diet: No diet order noted, but plan to start CHO Mod Medium  Note:  Results for STALEY, LUNZ (MRN [id number]) as of 03/14/2013 09:39  Ref. Range 03/13/2013 17:58 03/13/2013 19:14 03/14/2013 00:05 03/14/2013 04:03 03/14/2013 08:32  Glucose-Capillary Latest Range: 70-99 mg/dL 782 (H) 956 (H) 213 (H) 210 (H) 204 (H)   CBG this morning > 200 mg/dl despite receiving correction Novolog every 4 hours during night.  Note plan to start diet which will increase needs even further.  Ability to eat unknown.  May benefit from starting some basal insulin.  Takes 50/50 20 units tid with meals.  May benefit from starting NPH 10 units tid with meals and switch to 50/50-- or substitute NPH 10 units plus Novolog 10 units tid with meals-- as CBG indicate.  Would not recommend giving Amaryl in the hospital setting due to risk of hypoglycemia.  Request MD order a Hgb A1C. Thank you.  Enrigue Hashimi S. Elsie Lincoln, RN, CNS, CDE Inpatient Diabetes Program, team pager [phone]

## 2013-03-14 NOTE — Progress Notes (Signed)
Spoke to Dr. Tyson Alias regarding HL=0.18. Per RN there were no issues with infusion. He wants to re-bolus 12,000 units and increase the drip to 2600 units/hr. Will check another level in 6 hours. Pharmacy is to call elink once the next level results.  Louie Casa, PharmD, BCPS 03/14/2013, 8:25PM

## 2013-03-14 NOTE — Progress Notes (Signed)
  Echocardiogram 2D Echocardiogram has been performed.  Cathie Beams 03/14/2013, 9:49 AM

## 2013-03-14 NOTE — H&P (Addendum)
PULMONARY  / CRITICAL CARE MEDICINE  Name: Brian Parks MRN: [id number] DOB: [birth year]    ADMISSION DATE:  03/13/2013  REFERRING MD :  EDP PRIMARY SERVICE: PCCM  CHIEF COMPLAINT:  PE  BRIEF PATIENT DESCRIPTION: 47 y/o M who presented to [facility] ER 8/11 with known positive DVT per PCP.  CT eval with large saddle emboli.    SIGNIFICANT EVENTS / STUDIES:  outpt duplex pos left lower exr 8/11 - admit with PE / DVT  LINES / TUBES:  CULTURES:  ANTIBIOTICS:  SUBJECTIVE: no events over night  VITAL SIGNS: Temp:  [97.9 F (36.6 C)-98.6 F (37 C)] 97.9 F (36.6 C) (08/12 0404) Pulse Rate:  [90-111] 93 (08/12 0700) Resp:  [12-34] 15 (08/12 0700) BP: (109-155)/(60-103) 109/68 mmHg (08/12 0700) SpO2:  [90 %-98 %] 94 % (08/12 0700) Weight:  [124.7 kg (274 lb 14.6 oz)-124.739 kg (275 lb)] 124.739 kg (275 lb) (08/12 0459)  INTAKE / OUTPUT: Intake/Output     08/11 0701 - 08/12 0700 08/12 0701 - 08/13 0700   I.V. (mL/kg) 1173.8 (9.4)    Total Intake(mL/kg) 1173.8 (9.4)    Urine (mL/kg/hr) 1700    Total Output 1700     Net -526.2            PHYSICAL EXAMINATION: General:  No distress Neuro:  Awake,nonfocal HEENT:  jvd wnl Cardiovascular:  s1 s2 rrr no r/g Lungs:  CTA Abdomen:  Soft, obee, NT, nd no r /g Musculoskeletal:  Edema, dense all left lower ext unchanged  LABS:  CBC Recent Labs     03/13/13  1340  03/13/13  1409  WBC  9.6   --   HGB  14.6  15.0  HCT  40.8  44.0  PLT  129*   --    Coag's Recent Labs     03/13/13  1340  03/13/13  1647  APTT   --   31  INR  1.11   --    BMET Recent Labs     03/13/13  1409  NA  136  K  3.9  CL  104  BUN  18  CREATININE  1.10  GLUCOSE  292*   Electrolytes No results found for this basename: CALCIUM, MG, PHOS,  in the last 72 hours Sepsis Markers No results found for this basename: LACTICACIDVEN, PROCALCITON, O2SATVEN,  in the last 72 hours ABG No results found for this basename: PHART, PCO2ART, PO2ART,  in  the last 72 hours Liver Enzymes No results found for this basename: AST, ALT, ALKPHOS, BILITOT, ALBUMIN,  in the last 72 hours Cardiac Enzymes No results found for this basename: TROPONINI, PROBNP,  in the last 72 hours Glucose Recent Labs     03/13/13  1758  03/13/13  1914  03/14/13  0005  03/14/13  0403  03/14/13  0832  GLUCAP  182*  230*  174*  210*  204*    Imaging Ct Angio Chest W/cm &/or Wo Cm  03/13/2013   *RADIOLOGY REPORT*  Clinical Data: History of lower extremity blood clot, now with shortness of breath, dizziness and cough, evaluate for pulmonary embolism  CT ANGIOGRAPHY CHEST  Technique:  Multidetector CT imaging of the chest using the standard protocol during bolus administration of intravenous contrast. Multiplanar reconstructed images including MIPs were obtained and reviewed to evaluate the vascular anatomy.  Contrast: OMNIPAQUE IOHEXOL 350 MG/ML SOLN  Comparison: Bilateral lower extremity venous Doppler ultrasound - earlier same day  Vascular Findings:  There  is adequate opacification of the pulmonary arterial system of the main pulmonary artery measuring 299 HU.  There is saddle pulmonary emboli noted extending across the bifurcation of the main pulmonary artery with nonocclusive thrombus seen within both the right and left pulmonary arteries.  There is extension of mixed occlusive and nonocclusive thrombus into nearly all bilateral segmental pulmonary arteries, likely worse within the bilateral lower lobar pulmonary arteries, left greater than right.  There is mild enlargement the caliber the main pulmonary artery measuring approximately 36 mm in diameter.  There is no definitive evidence of right-sided heart strain.  There is no reflux of administered intravenous contrast into the hepatic venous system. There is no definitive evidence of pulmonary infarction.  Normal heart size.  Coronary artery calcifications.  No pericardial effusion.  Normal caliber of the thoracic  aorta.  Bovine configuration of the aortic arch.  No thoracic aortic dissection or periaortic stranding.  -------------------------------------------------------  Nonvascular findings:  Minimal bibasilar dependent ground-glass atelectasis.  No focal airspace opacities.  No definitive evidence of pulmonary infarction.  The central pulmonary airways are patent.  No pleural effusion or pneumothorax.  Scattered shoddy mediastinal lymph nodes are not enlarged by CT criteria.  No mediastinal, hilar or axillary lymphadenopathy.  Limited visualization of the upper abdomen suggests possible decreased attenuation of the hepatic parenchyma suggestive of hepatic steatosis.  No acute or aggressive osseous abnormalities.  A spinal stimulator is seen within the spinal canal of the lower thoracic spine.  IMPRESSION: 1.  Examination is positive for saddle pulmonary embolism with large bilateral occlusive or nonocclusive clot burden.  No definitive evidence of pulmonary infarction.  While there is enlargement of the caliber of the main pulmonary artery, there is no definite CT evidence of right-sided heart strain.  Further evaluation with cardiac echo may be performed as clinically indicated. 2.  Possible hepatic steatosis.  Above findings discussed with Dr. Rubin Payor at [phone].   Original Report Authenticated By: Tacey Ruiz, MD   US Venous Img Lower Unilateral Left  03/13/2013   *RADIOLOGY REPORT*  Clinical Data: But the left lower extremity swelling and pain  BILATERAL LOWER EXTREMITY VENOUS DUPLEX ULTRASOUND  Technique:  Gray-scale sonography with graded compression, as well as color Doppler and duplex ultrasound, were performed to evaluate the deep venous system of both lower extremities from the level of the common femoral vein through the popliteal and proximal calf veins.  Spectral Doppler was utilized to evaluate flow at rest and with distal augmentation maneuvers.  Comparison:  None.  Findings:  There is a visualized  thrombus beginning at the level of the mid superficial femoral vein and extending distally through the popliteal vein into the posterior tibial vein.  These portions of the deep venous system show absence of a normal phasicity and compressibility.  Common femoral vein, saphenous femoral junction, profunda femoral vein, and more proximal superficial femoral vein appear normal.  IMPRESSION: There is evidence of acute deep venous thrombosis in the left lower extremity as described above.I gave this report to Dr. Chinita Greenland by phone at  12:46.   Original Report Authenticated By: Esperanza Heir, M.D.   Dg Chest Port 1 View  03/14/2013   *RADIOLOGY REPORT*  Clinical Data: Cough.  Airspace disease.  PORTABLE CHEST - 1 VIEW  Comparison: CT chest 03/13/2013.  Findings: Low volume chest with basilar atelectasis. Monitoring leads are projected over the chest.  Cardiopericardial silhouette is accentuated by low inspiratory volumes.  Crowding of pulmonary vasculature is present. No convincing  evidence of pulmonary infarction in this patient with demonstrated pulmonary embolism. No airspace disease is present.  IMPRESSION: Low volume chest with basilar atelectasis.   Original Report Authenticated By: Andreas Newport, M.D.    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) Dyspnea High clot burden P:   -heparin gtt, high clot burden, will take over pharmacy dosing of heparin given clot burden if not therapeutic next level -Would NOT use lovenox -no coumadin etc for now until ensure no tpa needed -assess hypercoag panel prior to heparin. Has pos fam h/o - pending: Will need prot c , s at3 as outpt and on coumadin and clot burden reducedif not cause noted -no role filter at this stage -echo awaited, suspect chronic pulm HTN as well, as appears so good, and this amount of clot burden typically if acute not suriviable Consider empiric cpap  CARDIOVASCULAR A:  Tachycardia - in setting of  PE-resolved P:  -continue welchol, fenofibrate -echo. Assess pa htn , likely as he appears so good clinicaly - pending -see heme -need homocysteine level - 15, no need B vit Ana, esr  Awaited, - mom lupus  RENAL A:   P:   -monitor lytes / sr cr in am   GASTROINTESTINAL A:   P:   -heart healthy, carb modified diet No scd with clot No ambulation today as sub there IV hep   HEMATOLOGIC A:   PE / DVT sub therapeutic heparin, massive clot burden P:  -hypercoag panel as above -heparin gtt, will be more aggressive with pharmacy  -no ambulation today, sub ther -need lups anticoag, ana, esr, homocystein, pt 202010 gene mutation as under age 55 still  INFECTIOUS A:  No acute infectious process P:   -monitor fever curve / leukocytosis  ENDOCRINE A:   DM   P:   -SSI -hold amaryl,  tradjenta, metformin, humalog 50/50, likley to start afetr further observation intake  NEUROLOGIC A:   Chronic Back Pain P:   -continue oxy 40 mg Q12 (reduced from home Q8 dosing)  To sdu  Mcarthur Rossetti. Tyson Alias, MD, FACP Pgr: [phone] [place] [facility]  03/14/2013, 8:55 AM

## 2013-03-14 NOTE — Progress Notes (Addendum)
ANTICOAGULATION CONSULT NOTE  Pharmacy Consult for Heparin Indication: pulmonary embolus and DVT  Allergies  Allergen Reactions  . Lipitor (Atorvastatin) Other (See Comments)    Fatigue  . Losartan Nausea And Vomiting, Other (See Comments) and Cough    Dizziness   . Lyrica (Pregabalin) Other (See Comments)    blindness  . Prednisone Swelling    Legs swell    Patient Measurements: 76 in Height: 6\' 4"  (193 cm) Weight: 274 lb 14.6 oz (124.7 kg) IBW/kg (Calculated) : 86.8 Heparin Dosing Weight: 113 kg  Vital Signs: Temp: 98 F (36.7 C) (08/12 0006) Temp src: Oral (08/12 0006) BP: 141/92 mmHg (08/12 0000) Pulse Rate: 101 (08/12 0000)  Labs:  Recent Labs  03/13/13 1340 03/13/13 1409 03/13/13 1647 03/13/13 2310  HGB 14.6 15.0  --   --   HCT 40.8 44.0  --   --   PLT 129*  --   --   --   APTT  --   --  31  --   LABPROT 14.1  --   --   --   INR 1.11  --   --   --   HEPARINUNFRC  --   --   --  0.26*  CREATININE  --  1.10  --   --     Estimated Creatinine Clearance: 121.1 ml/min (by C-G formula based on Cr of 1.1).  Assessment: 47 y/o Male with DVT/PE for heparin  Goal of Therapy:  Heparin level 0.3-0.7 units/ml Monitor platelets by anticoagulation protocol: Yes   Plan:  Heparin 2000 units IV bolus, then increase heparin 2000 units/hr Follow-up am labs.  Geannie Risen, PharmD, BCPS  03/14/2013 1:04 AM

## 2013-03-15 LAB — URINALYSIS, ROUTINE W REFLEX MICROSCOPIC
Bilirubin Urine: NEGATIVE
Hgb urine dipstick: NEGATIVE
Nitrite: NEGATIVE
Specific Gravity, Urine: 1.008 (ref 1.005–1.030)
pH: 5.5 (ref 5.0–8.0)

## 2013-03-15 LAB — CARDIOLIPIN ANTIBODIES, IGG, IGM, IGA
Anticardiolipin IgG: 7 GPL U/mL — ABNORMAL LOW (ref <23)
Anticardiolipin IgM: 4 MPL U/mL — ABNORMAL LOW (ref <11)

## 2013-03-15 LAB — FACTOR 5 ASSAY: Factor V Activity: 93 % (ref 65–150)

## 2013-03-15 LAB — HEPARIN LEVEL (UNFRACTIONATED)
Heparin Unfractionated: 0.72 IU/mL — ABNORMAL HIGH (ref 0.30–0.70)
Heparin Unfractionated: 0.54 IU/mL (ref 0.30–0.70)

## 2013-03-15 LAB — GLUCOSE, CAPILLARY
Glucose-Capillary: 216 mg/dL — ABNORMAL HIGH (ref 70–99)
Glucose-Capillary: 200 mg/dL — ABNORMAL HIGH (ref 70–99)
Glucose-Capillary: 179 mg/dL — ABNORMAL HIGH (ref 70–99)
Glucose-Capillary: 194 mg/dL — ABNORMAL HIGH (ref 70–99)

## 2013-03-15 LAB — CBC
MCH: 30.3 pg (ref 26.0–34.0)
MCV: 85.5 fL (ref 78.0–100.0)
Platelets: 145 10*3/uL — ABNORMAL LOW (ref 150–400)
RBC: 4.13 MIL/uL — ABNORMAL LOW (ref 4.22–5.81)

## 2013-03-15 LAB — PROTEIN S, TOTAL: Protein S Ag, Total: 120 % (ref 60–150)

## 2013-03-15 MED ORDER — INSULIN GLARGINE 100 UNIT/ML ~~LOC~~ SOLN
10.0000 [IU] | Freq: Every day | SUBCUTANEOUS | Status: DC
  Administered 2013-03-15: 10 [IU] via SUBCUTANEOUS
  Filled 2013-03-15: qty 0.1

## 2013-03-15 MED ORDER — WARFARIN VIDEO: Freq: Once | Status: DC

## 2013-03-15 MED ORDER — ZOLPIDEM TARTRATE 5 MG PO TABS: 5.0000 mg | ORAL_TABLET | Freq: Once | ORAL | Status: AC

## 2013-03-15 MED ORDER — ZOLPIDEM TARTRATE 5 MG PO TABS
ORAL_TABLET | ORAL | Status: AC
  Administered 2013-03-15: 5 mg
  Filled 2013-03-15: qty 1

## 2013-03-15 MED ORDER — PATIENT'S GUIDE TO USING COUMADIN BOOK
Freq: Once | Status: AC
  Administered 2013-03-15: 18:00:00
  Filled 2013-03-15: qty 1

## 2013-03-15 MED ORDER — HEPARIN (PORCINE) IN NACL 100-0.45 UNIT/ML-% IJ SOLN
2650.0000 [IU]/h | INTRAMUSCULAR | Status: AC
  Administered 2013-03-15 – 2013-03-16 (×2): 2650 [IU]/h via INTRAVENOUS
  Filled 2013-03-15 (×3): qty 250

## 2013-03-15 MED ORDER — WARFARIN SODIUM 10 MG PO TABS
10.0000 mg | ORAL_TABLET | Freq: Once | ORAL | Status: AC
  Administered 2013-03-15: 10 mg via ORAL
  Filled 2013-03-15: qty 1

## 2013-03-15 MED ORDER — WARFARIN - PHARMACIST DOSING INPATIENT
Freq: Every day | Status: DC
  Administered 2013-03-15: 18:00:00

## 2013-03-15 MED ORDER — TRAMADOL HCL 50 MG PO TABS
ORAL_TABLET | ORAL | Status: AC
  Administered 2013-03-15: 50 mg via ORAL
  Filled 2013-03-15: qty 1

## 2013-03-15 MED ORDER — OXYCODONE-ACETAMINOPHEN 5-325 MG PO TABS
2.0000 | ORAL_TABLET | Freq: Once | ORAL | Status: AC
  Administered 2013-03-15: 2 via ORAL
  Filled 2013-03-15: qty 2

## 2013-03-15 MED ORDER — TRAMADOL HCL 50 MG PO TABS: 50.0000 mg | ORAL_TABLET | Freq: Once | ORAL | Status: AC

## 2013-03-15 NOTE — Progress Notes (Signed)
PULMONARY  / CRITICAL CARE MEDICINE  Name: Brian Parks MRN: [id number] DOB: 01-21-1966    ADMISSION DATE:  03/13/2013  REFERRING MD :  EDP PRIMARY SERVICE: PCCM  CHIEF COMPLAINT:  PE  BRIEF PATIENT DESCRIPTION: 47 y/o M who presented to [facility] ER 8/11 with known positive DVT per PCP.  CT eval with large saddle emboli.    SIGNIFICANT EVENTS / STUDIES:  outpt duplex pos left lower ext 8/11 - admit with PE / DVT 8/11 CTA chest>>>1. Examination is positive for saddle pulmonary embolism with large bilateral occlusive or nonocclusive clot burden. No definitive evidence of pulmonary infarction. While there is enlargement of the caliber of the main pulmonary artery, there is no definite CT evidence of right-sided heart strain. Further evaluation with cardiac echo may be performed as clinically Indicated. 2. Possible hepatic steatosis. 8/11 2D echo>>> EF50-55%, grade 1 diastolic dysfunction, PA pressure , normal RV  LINES / TUBES:  CULTURES:  ANTIBIOTICS:  SUBJECTIVE: LLE pain but improved.  Denies chest pain, SOB.   VITAL SIGNS: Temp:  [98 F (36.7 C)-98.6 F (37 C)] 98.6 F (37 C) (08/13 1317) Pulse Rate:  [98-100] 98 (08/13 1317) Resp:  [18] 18 (08/13 1317) BP: (130-144)/(80-96) 144/96 mmHg (08/13 1317) SpO2:  [93 %] 93 % (08/13 1317)  INTAKE / OUTPUT: Intake/Output     08/12 0701 - 08/13 0700 08/13 0701 - 08/14 0700   P.O.  840   I.V. (mL/kg) 480.7 (3.9) 381.1 (3.1)   Total Intake(mL/kg) 480.7 (3.9) 1221.1 (9.8)   Urine (mL/kg/hr) 1000 (0.3) 1900 (2)   Total Output 1000 1900   Net -519.3 -678.9          PHYSICAL EXAMINATION: General:  No distress Neuro:  Awake,nonfocal HEENT:  Mm moist, no JVD Cardiovascular:  s1 s2 rrr no r/g Lungs:  CTA Abdomen:  Soft, obee, NT, nd no r /g Musculoskeletal:  Edema, dense left lower ext, improved per pt  LABS:  CBC Recent Labs     03/13/13  1340  03/13/13  1409  03/14/13  0852  03/15/13  0200  WBC  9.6   --    7.6  8.3  HGB  14.6  15.0  12.7*  12.5*  HCT  40.8  44.0  36.8*  35.3*  PLT  129*   --   124*  145*   Coag's Recent Labs     03/13/13  1340  03/13/13  1647  APTT   --   31  INR  1.11   --    BMET Recent Labs     03/13/13  1409  03/14/13  0852  NA  136  135  K  3.9  3.9  CL  104  101  CO2   --   25  BUN  18  14  CREATININE  1.10  0.95  GLUCOSE  292*  224*   Electrolytes Recent Labs     03/14/13  0852  CALCIUM  8.8   Sepsis Markers No results found for this basename: LACTICACIDVEN, PROCALCITON, O2SATVEN,  in the last 72 hours ABG No results found for this basename: PHART, PCO2ART, PO2ART,  in the last 72 hours Liver Enzymes No results found for this basename: AST, ALT, ALKPHOS, BILITOT, ALBUMIN,  in the last 72 hours Cardiac Enzymes No results found for this basename: TROPONINI, PROBNP,  in the last 72 hours Glucose Recent Labs     03/14/13  1718  03/14/13  2117  03/14/13  2254  03/15/13  0413  03/15/13  0801  03/15/13  1149  GLUCAP  282*  216*  200*  135*  179*  220*    Imaging Ct Angio Chest W/cm &/or Wo Cm  03/13/2013   *RADIOLOGY REPORT*  Clinical Data: History of lower extremity blood clot, now with shortness of breath, dizziness and cough, evaluate for pulmonary embolism  CT ANGIOGRAPHY CHEST  Technique:  Multidetector CT imaging of the chest using the standard protocol during bolus administration of intravenous contrast. Multiplanar reconstructed images including MIPs were obtained and reviewed to evaluate the vascular anatomy.  Contrast: OMNIPAQUE IOHEXOL 350 MG/ML SOLN  Comparison: Bilateral lower extremity venous Doppler ultrasound - earlier same day  Vascular Findings:  There is adequate opacification of the pulmonary arterial system of the main pulmonary artery measuring 299 HU.  There is saddle pulmonary emboli noted extending across the bifurcation of the main pulmonary artery with nonocclusive thrombus seen within both the right and left  pulmonary arteries.  There is extension of mixed occlusive and nonocclusive thrombus into nearly all bilateral segmental pulmonary arteries, likely worse within the bilateral lower lobar pulmonary arteries, left greater than right.  There is mild enlargement the caliber the main pulmonary artery measuring approximately 36 mm in diameter.  There is no definitive evidence of right-sided heart strain.  There is no reflux of administered intravenous contrast into the hepatic venous system. There is no definitive evidence of pulmonary infarction.  Normal heart size.  Coronary artery calcifications.  No pericardial effusion.  Normal caliber of the thoracic aorta.  Bovine configuration of the aortic arch.  No thoracic aortic dissection or periaortic stranding.  -------------------------------------------------------  Nonvascular findings:  Minimal bibasilar dependent ground-glass atelectasis.  No focal airspace opacities.  No definitive evidence of pulmonary infarction.  The central pulmonary airways are patent.  No pleural effusion or pneumothorax.  Scattered shoddy mediastinal lymph nodes are not enlarged by CT criteria.  No mediastinal, hilar or axillary lymphadenopathy.  Limited visualization of the upper abdomen suggests possible decreased attenuation of the hepatic parenchyma suggestive of hepatic steatosis.  No acute or aggressive osseous abnormalities.  A spinal stimulator is seen within the spinal canal of the lower thoracic spine.  IMPRESSION: 1.  Examination is positive for saddle pulmonary embolism with large bilateral occlusive or nonocclusive clot burden.  No definitive evidence of pulmonary infarction.  While there is enlargement of the caliber of the main pulmonary artery, there is no definite CT evidence of right-sided heart strain.  Further evaluation with cardiac echo may be performed as clinically indicated. 2.  Possible hepatic steatosis.  Above findings discussed with Dr. Rubin Payor at [phone].   Original  Report Authenticated By: Tacey Ruiz, MD   Dg Chest Port 1 View  03/14/2013   *RADIOLOGY REPORT*  Clinical Data: Cough.  Airspace disease.  PORTABLE CHEST - 1 VIEW  Comparison: CT chest 03/13/2013.  Findings: Low volume chest with basilar atelectasis. Monitoring leads are projected over the chest.  Cardiopericardial silhouette is accentuated by low inspiratory volumes.  Crowding of pulmonary vasculature is present. No convincing evidence of pulmonary infarction in this patient with demonstrated pulmonary embolism. No airspace disease is present.  IMPRESSION: Low volume chest with basilar atelectasis.   Original Report Authenticated By: Andreas Newport, M.D.    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) Dyspnea High clot burden Lupus anticoag NEG, homocysteine wnl, factor 5 leiden neg Protein C, protein S elevated  P:   -Cont  heparin gtt, pharmacy dosing -start coumadin per pharmacy  -Will need prot c , s at3 as outpt once on coumadin and clot burden reduced -no role filter at this stage -02 as needed   CARDIOVASCULAR A:  Tachycardia - in setting of PE-resolved Pulm HTN - PA pressure P:  -continue welchol, fenofibrate -see heme Ana, esr  pending - mom has lupus  RENAL A:   P:   -monitor lytes / sr cr in am   GASTROINTESTINAL A:   P:   -heart healthy, carb modified diet   HEMATOLOGIC A:   PE / DVT sub therapeutic heparin, massive clot burden P:  -hypercoag panel as above -no SCD with clot  -heparin gtt, will be more aggressive with pharmacy  -no ambulation today, sub therapeutic -ana, esr pending   INFECTIOUS A:  No acute infectious process P:   -monitor fever curve / leukocytosis  ENDOCRINE A:   DM   P:   -SSI -start lantus 8/13  NEUROLOGIC A:   Chronic Back Pain LLE pain r/t DVT P:   -continue oxy 40 mg Q12 (reduced from home Q8 dosing)  WHITEHEART,KATHRYN, NP 03/15/2013  2:46 PM Pager: [phone] or [phone]  [phone]  *Care during the described time interval was provided by me and/or other providers on the critical care team. I have reviewed this patient's available data, including medical history, events of note, physical examination and test results as part of my evaluation.  Will start coumadin, transfer care to [facility], PCCM will sign off, please call back if needed.  Alyson Reedy, M.D. [facility] Pulmonary/Critical Care Medicine. Pager: [phone]. After hours pager: [phone].

## 2013-03-15 NOTE — Progress Notes (Signed)
eLink Physician-Brief Progress Note Patient Name: Brian Parks DOB: September 03, 1965 MRN: [id number]  Date of Service  03/15/2013   HPI/Events of Note   Headache anxietty cough  eICU Interventions  ambien tramadol    Intervention Category Intermediate Interventions: Pain - evaluation and management Minor Interventions: Agitation / anxiety - evaluation and management  Armelia Penton 03/15/2013, 12:22 AM

## 2013-03-15 NOTE — Progress Notes (Addendum)
ANTICOAGULATION CONSULT NOTE  Pharmacy Consult for Heparin/warfarin Indication: pulmonary embolus and DVT  Allergies  Allergen Reactions  . Lipitor [Atorvastatin] Other (See Comments)    Fatigue  . Losartan Nausea And Vomiting, Other (See Comments) and Cough    Dizziness   . Lyrica [Pregabalin] Other (See Comments)    blindness  . Prednisone Swelling    Legs swell    Patient Measurements: 76 in Height: 6\' 4"  (193 cm) Weight: 275 lb (124.739 kg) IBW/kg (Calculated) : 86.8 Heparin Dosing Weight: 113 kg  Vital Signs: Temp: 98.6 F (37 C) (08/13 1317) Temp src: Oral (08/13 1317) BP: 144/96 mmHg (08/13 1317) Pulse Rate: 98 (08/13 1317)  Labs:  Recent Labs  03/13/13 1340 03/13/13 1409 03/13/13 1647  03/14/13 0852 03/14/13 1734 03/15/13 0200 03/15/13 0950  HGB 14.6 15.0  --   --  12.7*  --  12.5*  --   HCT 40.8 44.0  --   --  36.8*  --  35.3*  --   PLT 129*  --   --   --  124*  --  145*  --   APTT  --   --  31  --   --   --   --   --   LABPROT 14.1  --   --   --   --   --   --   --   INR 1.11  --   --   --   --   --   --   --   HEPARINUNFRC  --   --   --   < > 0.19* 0.18* 1.09* 0.72*  CREATININE  --  1.10  --   --  0.95  --   --   --   < > = values in this interval not displayed.  Estimated Creatinine Clearance: 140.2 ml/min (by C-G formula based on Cr of 0.95).  Assessment: 47 y/o Male with DVT and saddle PE currently on heparin with new orders to start warfarin tonight. Morning heparin level just slightly above goal but no change was made and follow up level ordered for this afternoon. Orders to start coumadin tonight. Baseline INR 1.1, calculated warfarin score of 9 suggesting high coumadin requirements.  Goal of Therapy:  INR 2-3 Heparin level 0.3-0.7 units/ml Monitor platelets by anticoagulation protocol: Yes   Plan: 1. Heparin level at 1800 then daily with cbc 2. Warfarin 10mg  tonight 3. Daily INR 4. Begin warfarin education  Sheppard Coil PharmD.,  BCPS Clinical Pharmacist Pager [phone] 03/15/2013 5:03 PM    PM heparin level is within therapeutic range at 0.54. Given substantial clot burden and downward trend of heparin will make small rate adjustment and follow up with level in am. 03/15/2013 7:00 PM

## 2013-03-15 NOTE — Care Management Note (Unsigned)
    Page 1 of 2   03/17/2013     2:38:29 PM   CARE MANAGEMENT NOTE 03/17/2013  Patient:  Brian Parks, Brian Parks   Account Number:  [id number]  Date Initiated:  03/15/2013  Documentation initiated by:  Anntionette Madkins  Subjective/Objective Assessment:   PT ADM WITH DVT AND PE ON 03/13/13.  PTA, PT INDEPENDENT, LIVES WITH SON.     Action/Plan:   WILL FOLLOW FOR DISCHARGE NEEDS AS PT PROGRESSES.   Anticipated DC Date:  03/17/2013   Anticipated DC Plan:  HOME/SELF CARE      DC Planning Services  CM consult  Medication Assistance      Choice offered to / List presented to:             Status of service:  In process, will continue to follow Medicare Important Message given?   (If response is "NO", the following Medicare IM given date fields will be blank) Date Medicare IM given:   Date Additional Medicare IM given:    Discharge Disposition:    Per UR Regulation:  Reviewed for med. necessity/level of care/duration of stay  If discussed at Long Length of Stay Meetings, dates discussed:    Comments:  03/17/13 Arianny Pun,RN,BSN [phone] PT LIKELY TO HAVE TO STAY IN HOUSE UNTIL THERAPEUTIC INR, AS UNABLE TO AFFORD $200 LOVENOX COPAY.  DEOS NOT QUALIFY FOR [place] MATCH PROGRAM OR OTHER MEDICATION ASSISTANCE PROGRAMS.  DISCUSSED WITH ATTENDING MD.  03/16/13 Royalty Fakhouri,RN,BSN [phone] MET WITH PT TO DISCUSS COPAY AMT; HE STATES HE CANNOT AFFORD THIS, AS HE IS ON DISABILITY.  HE HAS NO OTHER OPTIONS FOR COMING UP WITH THE MONEY.  WILL DISCUSS WITH PHYSICIAN.  03/16/13 Kamayah Pillay,RN,BSN [phone] PT FOR DC HOME ON LOVENOX THERAPY.   PT WILL HAVE A 33% COPAY OF TOTAL COST OF MED FOR LOVENOX.  PER PT'S PHARMACY, [facility] ON [address], TOTAL COST OF 10 SYRINGES (5 DAYS) OF GENERIC LOVENOX IS $600.  COPAY WILL BE $198.  WILL INFORM PT AND SEE IF HE CAN AFFORD THIS.

## 2013-03-16 DIAGNOSIS — K5900 Constipation, unspecified: Secondary | ICD-10-CM

## 2013-03-16 DIAGNOSIS — E1165 Type 2 diabetes mellitus with hyperglycemia: Secondary | ICD-10-CM

## 2013-03-16 DIAGNOSIS — R030 Elevated blood-pressure reading, without diagnosis of hypertension: Secondary | ICD-10-CM

## 2013-03-16 DIAGNOSIS — F1120 Opioid dependence, uncomplicated: Secondary | ICD-10-CM

## 2013-03-16 DIAGNOSIS — F1920 Other psychoactive substance dependence, uncomplicated: Secondary | ICD-10-CM

## 2013-03-16 DIAGNOSIS — D649 Anemia, unspecified: Secondary | ICD-10-CM

## 2013-03-16 LAB — BASIC METABOLIC PANEL
CO2: 23 mEq/L (ref 19–32)
Calcium: 9.5 mg/dL (ref 8.4–10.5)
Chloride: 99 mEq/L (ref 96–112)
Glucose, Bld: 197 mg/dL — ABNORMAL HIGH (ref 70–99)
Sodium: 134 mEq/L — ABNORMAL LOW (ref 135–145)

## 2013-03-16 LAB — CBC
HCT: 36.2 % — ABNORMAL LOW (ref 39.0–52.0)
Hemoglobin: 12.8 g/dL — ABNORMAL LOW (ref 13.0–17.0)
MCH: 30.1 pg (ref 26.0–34.0)
MCHC: 35.4 g/dL (ref 30.0–36.0)
RBC: 4.25 MIL/uL (ref 4.22–5.81)

## 2013-03-16 LAB — PROTIME-INR
INR: 1.08 (ref 0.00–1.49)
Prothrombin Time: 13.8 seconds (ref 11.6–15.2)

## 2013-03-16 LAB — HEPARIN LEVEL (UNFRACTIONATED): Heparin Unfractionated: 0.52 IU/mL (ref 0.30–0.70)

## 2013-03-16 LAB — GLUCOSE, CAPILLARY
Glucose-Capillary: 186 mg/dL — ABNORMAL HIGH (ref 70–99)
Glucose-Capillary: 220 mg/dL — ABNORMAL HIGH (ref 70–99)
Glucose-Capillary: 243 mg/dL — ABNORMAL HIGH (ref 70–99)

## 2013-03-16 MED ORDER — POLYETHYLENE GLYCOL 3350 17 G PO PACK
17.0000 g | PACK | Freq: Every day | ORAL | Status: DC
  Administered 2013-03-16 – 2013-03-19 (×4): 17 g via ORAL
  Filled 2013-03-16 (×4): qty 1

## 2013-03-16 MED ORDER — INSULIN ASPART 100 UNIT/ML ~~LOC~~ SOLN
0.0000 [IU] | Freq: Every day | SUBCUTANEOUS | Status: DC
  Administered 2013-03-17: 2 [IU] via SUBCUTANEOUS

## 2013-03-16 MED ORDER — DOCUSATE SODIUM 100 MG PO CAPS
100.0000 mg | ORAL_CAPSULE | Freq: Two times a day (BID) | ORAL | Status: DC
  Administered 2013-03-16 (×2): 100 mg via ORAL
  Administered 2013-03-17: 11:00:00 via ORAL
  Administered 2013-03-17 – 2013-03-19 (×4): 100 mg via ORAL
  Filled 2013-03-16 (×9): qty 1

## 2013-03-16 MED ORDER — ACETAMINOPHEN 325 MG PO TABS: 650.0000 mg | ORAL_TABLET | Freq: Four times a day (QID) | ORAL | Status: DC | PRN

## 2013-03-16 MED ORDER — INSULIN GLARGINE 100 UNIT/ML ~~LOC~~ SOLN
15.0000 [IU] | Freq: Every day | SUBCUTANEOUS | Status: DC
  Administered 2013-03-16: 15 [IU] via SUBCUTANEOUS
  Filled 2013-03-16 (×2): qty 0.15

## 2013-03-16 MED ORDER — BISACODYL 10 MG RE SUPP: 10.0000 mg | Freq: Every day | RECTAL | Status: DC | PRN

## 2013-03-16 MED ORDER — ENOXAPARIN (LOVENOX) PATIENT EDUCATION KIT
PACK | Freq: Once | Status: DC
  Filled 2013-03-16: qty 1

## 2013-03-16 MED ORDER — ENOXAPARIN SODIUM 150 MG/ML ~~LOC~~ SOLN
130.0000 mg | Freq: Two times a day (BID) | SUBCUTANEOUS | Status: DC
  Administered 2013-03-16 – 2013-03-19 (×7): 130 mg via SUBCUTANEOUS
  Filled 2013-03-16 (×8): qty 1

## 2013-03-16 MED ORDER — OXYCODONE HCL 5 MG PO TABS
10.0000 mg | ORAL_TABLET | Freq: Four times a day (QID) | ORAL | Status: DC | PRN
  Administered 2013-03-17: 10 mg via ORAL
  Filled 2013-03-16: qty 2

## 2013-03-16 MED ORDER — HYDROCHLOROTHIAZIDE 12.5 MG PO CAPS
12.5000 mg | ORAL_CAPSULE | Freq: Every day | ORAL | Status: DC
  Administered 2013-03-16 – 2013-03-19 (×4): 12.5 mg via ORAL
  Filled 2013-03-16 (×4): qty 1

## 2013-03-16 MED ORDER — WARFARIN SODIUM 10 MG PO TABS
10.0000 mg | ORAL_TABLET | Freq: Once | ORAL | Status: AC
  Administered 2013-03-16: 10 mg via ORAL
  Filled 2013-03-16: qty 1

## 2013-03-16 MED ORDER — SENNA 8.6 MG PO TABS
2.0000 | ORAL_TABLET | Freq: Every day | ORAL | Status: DC
  Administered 2013-03-16 – 2013-03-18 (×3): 17.2 mg via ORAL
  Filled 2013-03-16 (×4): qty 2

## 2013-03-16 MED ORDER — INSULIN ASPART 100 UNIT/ML ~~LOC~~ SOLN
3.0000 [IU] | Freq: Three times a day (TID) | SUBCUTANEOUS | Status: DC
  Administered 2013-03-16 – 2013-03-17 (×4): 3 [IU] via SUBCUTANEOUS

## 2013-03-16 MED ORDER — INSULIN ASPART 100 UNIT/ML ~~LOC~~ SOLN
0.0000 [IU] | Freq: Three times a day (TID) | SUBCUTANEOUS | Status: DC
  Administered 2013-03-16 (×2): 7 [IU] via SUBCUTANEOUS
  Administered 2013-03-17: 11 [IU] via SUBCUTANEOUS
  Administered 2013-03-17 (×2): 4 [IU] via SUBCUTANEOUS
  Administered 2013-03-18 (×2): 7 [IU] via SUBCUTANEOUS
  Administered 2013-03-18: 11 [IU] via SUBCUTANEOUS
  Administered 2013-03-19: 7 [IU] via SUBCUTANEOUS

## 2013-03-16 NOTE — Progress Notes (Addendum)
[facility] PROGRESS NOTE  Brian Parks ZOX:[id number] DOB: [birth year] DOA: 03/13/2013 PCP: No primary provider on file.  Assessment/Plan  Large saddle pulmonary embolism, currently on room air and not dyspneic, and DVT.  Has been on heparin gtt for three days. -  Transition to lovenox with slight overestimate of dose and follow anti-Xa levels closely -  Continue coumadin -  No troponin and BNP sent at admission -  ECHO with PA pressure of and no evidence of right heart strai -  Anticipate life long anticoagulation -  Lovenox teaching for home.  Should do twice daily dosing instead of once daily dosing until therapeutic. -  Hypercoagulable panel:  Protein C, protein S elevated.  Repeat panel after off heparin/lovenox and clot burden reduced.  Lupus anticoag NEG, homocysteine wnl, factor 5 leiden neg, AT III wnl.  Beta-2 glycoprotein igM 22 (mildly elevated), cardiolipin ab neg -  FHx of lupus:  Send ANA with AML -  F/u with PCP regarding malignancy screening -  Okay to ambulate on lovenox   Grade 1 diastolic dysfunction:  Strict I/O and monitor daily weights  Tachycardia, resolving, likely secondary to pulmonary embolism.  T2DM, fingersticks still elevated -  Increase lantus to 15 units -  Continue high dose SSI and add aspart 3 units with meals -  Add HS insulin  HTN with mildly elevated blood pressures -  Start HCTZ  Chronic back pain and now LLE pain due to DVT -  Weaned some with patient's approval to twice daily oxycontin with PRN medication prior to ambulation  Constipation: -  Add colace, senna, miralax, and bisacodyl prn  Mild hyponatremia, repeat as outpatient Mild normocytic anemia, likely due to PE.  No evidence of bleeding.  Repeat as outpatient.   Diet:  Heart Access:  PIV IVF:  Off Proph:  Therapeutic Lovenox  Code Status: Full Family Communication: Patient alone Disposition Plan: Home in one to 2 days once anti-10 A. levels are at goal.   Patient has followup appointment scheduled with primary care doctor for Monday for INR check   Consultants:  PCCM  Diabetic educator  SIGNIFICANT EVENTS / STUDIES:  outpt duplex pos left lower ext  8/11 - admit with PE / DVT  8/11 CTA chest>>>1. Examination is positive for saddle pulmonary embolism with large bilateral occlusive or nonocclusive clot burden. No definitive evidence of pulmonary infarction. While there is enlargement of the caliber of the main pulmonary artery, there is no definite CT evidence of right-sided heart strain. Further evaluation with cardiac echo may be performed as clinically Indicated. 2. Possible hepatic steatosis.  8/11 2D echo>>> EF50-55%, grade 1 diastolic dysfunction, PA pressure , normal RV   Antibiotics:  None   HPI/Subjective:  Newly developed the bathroom without dyspnea on room air. Denies chest pain but has left lower stomach pain. Eating well. Voiding easily but no bowel movement in almost one week.  Objective: Filed Vitals:   03/15/13 1317 03/15/13 2014 03/16/13 0405 03/16/13 0443  BP: 144/96 146/89 153/92   Pulse: 98 98 93   Temp: 98.6 F (37 C) 98.5 F (36.9 C) 98.8 F (37.1 C)   TempSrc: Oral Oral Oral   Resp: 18 18 18    Height:      Weight:    125.193 kg (276 lb)  SpO2: 93% 95% 92%     Intake/Output Summary (Last 24 hours) at 03/16/13 1055 Last data filed at 03/16/13 1000  Gross per 24 hour  Intake 3415.56 ml  Output   4325 ml  Net -909.44 ml   Filed Weights   03/13/13 1900 03/14/13 0459 03/16/13 0443  Weight: 124.7 kg (274 lb 14.6 oz) 124.739 kg (275 lb) 125.193 kg (276 lb)    Exam:   General:  Obese Caucasian male,  No acute distress  HEENT:  NCAT, MMM  Cardiovascular:  RRR, nl S1, S2 no mrg, 2+ pulses, warm extremities  Respiratory:  CTAB, no increased WOB  Abdomen:  NABS, soft, NT/ND  MSK:  Normal tone and bulk, 2+ left LEE with TTP and mild injection.    Neuro:  Grossly intact  Data  Reviewed: Basic Metabolic Panel:  Recent Labs Lab 03/13/13 1409 03/14/13 0852 03/16/13 0430  NA 136 135 134*  K 3.9 3.9 3.7  CL 104 101 99  CO2  --  25 23  GLUCOSE 292* 224* 197*  BUN 18 14 10   CREATININE 1.10 0.95 0.98  CALCIUM  --  8.8 9.5   Liver Function Tests: No results found for this basename: AST, ALT, ALKPHOS, BILITOT, PROT, ALBUMIN,  in the last 168 hours No results found for this basename: LIPASE, AMYLASE,  in the last 168 hours No results found for this basename: AMMONIA,  in the last 168 hours CBC:  Recent Labs Lab 03/13/13 1340 03/13/13 1409 03/14/13 0852 03/15/13 0200 03/16/13 0430  WBC 9.6  --  7.6 8.3 8.3  HGB 14.6 15.0 12.7* 12.5* 12.8*  HCT 40.8 44.0 36.8* 35.3* 36.2*  MCV 85.7  --  86.6 85.5 85.2  PLT 129*  --  124* 145* 163   Cardiac Enzymes: No results found for this basename: CKTOTAL, CKMB, CKMBINDEX, TROPONINI,  in the last 168 hours BNP (last 3 results) No results found for this basename: PROBNP,  in the last 8760 hours CBG:  Recent Labs Lab 03/15/13 1149 03/15/13 1613 03/15/13 2011 03/16/13 0034 03/16/13 0404  GLUCAP 220* 194* 238* 243* 200*    Recent Results (from the past 240 hour(s))  MRSA PCR SCREENING     Status: None   Collection Time    03/13/13  7:12 PM      Result Value Range Status   MRSA by PCR NEGATIVE  NEGATIVE Final   Comment:            The GeneXpert MRSA Assay (FDA     approved for NASAL specimens     only), is one component of a     comprehensive MRSA colonization     surveillance program. It is not     intended to diagnose MRSA     infection nor to guide or     monitor treatment for     MRSA infections.     Studies: No results found.  Scheduled Meds: . colesevelam  1,875 mg Oral BID WC  . enoxaparin (LOVENOX) injection  130 mg Subcutaneous Q12H  . enoxaparin   Does not apply Once  . fenofibrate  160 mg Oral QHS  . insulin aspart  0-20 Units Subcutaneous Q4H  . insulin glargine  15 Units  Subcutaneous QHS  . OxyCODONE  40 mg Oral Q12H  . warfarin  10 mg Oral ONCE-1800  . warfarin   Does not apply Once  . Warfarin - Pharmacist Dosing Inpatient   Does not apply q1800   Continuous Infusions:   Active Problems:   Pulmonary emboli   DVT (deep venous thrombosis)    Time spent: 30 min    Brian Parks  [facility] Pager  [phone]. If 7PM-7AM, please contact night-coverage at [email or web address], password [facility] 03/16/2013, 10:55 AM  LOS: 3 days

## 2013-03-16 NOTE — Progress Notes (Signed)
Patient desires to not wear CPAP tonight, feels that he sleeps "great" lately.  Advised patient of MD's consideration of empiric use of CPAP, and to let RT know should he change his mind.

## 2013-03-16 NOTE — Progress Notes (Signed)
ANTICOAGULATION CONSULT NOTE - Follow Up Consult  Pharmacy Consult for Heparin->Lovenox, Coumadin Indication: pulmonary embolus and DVT, left lower leg  Allergies  Allergen Reactions  . Lipitor [Atorvastatin] Other (See Comments)    Fatigue  . Losartan Nausea And Vomiting, Other (See Comments) and Cough    Dizziness   . Lyrica [Pregabalin] Other (See Comments)    blindness  . Prednisone Swelling    Legs swell    Patient Measurements: Height: 6\' 4"  (193 cm) Weight: 276 lb (125.193 kg) IBW/kg (Calculated) : 86.8  Vital Signs: Temp: 98.8 F (37.1 C) (08/14 0405) Temp src: Oral (08/14 0405) BP: 153/92 mmHg (08/14 0405) Pulse Rate: 93 (08/14 0405)  Labs:  Recent Labs  03/13/13 1340 03/13/13 1409 03/13/13 1647  03/14/13 0852  03/15/13 0200 03/15/13 0950 03/15/13 1742 03/16/13 0430  HGB 14.6 15.0  --   --  12.7*  --  12.5*  --   --  12.8*  HCT 40.8 44.0  --   --  36.8*  --  35.3*  --   --  36.2*  PLT 129*  --   --   --  124*  --  145*  --   --  163  APTT  --   --  31  --   --   --   --   --   --   --   LABPROT 14.1  --   --   --   --   --   --   --   --  13.8  INR 1.11  --   --   --   --   --   --   --   --  1.08  HEPARINUNFRC  --   --   --   < > 0.19*  < > 1.09* 0.72* 0.54 0.52  CREATININE  --  1.10  --   --  0.95  --   --   --   --  0.98  < > = values in this interval not displayed.  Estimated Creatinine Clearance: 136.2 ml/min (by C-G formula based on Cr of 0.98).  Assessment:   Day #3 IV heparin for saddle PE and LLE DVT.  Heparin level therapeutic (0.52) on 2650 units/hr.  To transition to Lovenox this morning, and monitor anti-Xa level.   Coumadin begun on 8/13 with 10 mg. INR 1.08.  Day # 2 overlap.  Goal of Therapy:  INR 2-3 Anti-Xa level 0.6-1.2 units/ml 4hrs after LMWH dose given Monitor platelets by anticoagulation protocol: Yes   Plan:   Stop heparin drip at 9am (done).  Lovenox 130 mg SQ q12hrs to begin at 10am.  Will check ant-Xa level 4 hrs  after 3rd, dose => 2pm on 8/15.   Repeat Coumadin 10 mg today.   Discussed plans with patient, as well as Coumadin precautions, monitoring and potential drug and food interactions.   RN to instruct patient on Lovenox self-administration.   Lovenox discharge kits are on back order. Can view [facility] Lovenox video.  Self-administers insulin.  Brian Parks, [place] Pager: [phone] 03/16/2013,9:07 AM

## 2013-03-16 NOTE — Progress Notes (Signed)
Patient says he does not need cpap. Patient refused.

## 2013-03-17 LAB — CBC
Hemoglobin: 13.5 g/dL (ref 13.0–17.0)
MCH: 29.5 pg (ref 26.0–34.0)
MCV: 84.9 fL (ref 78.0–100.0)
Platelets: 172 10*3/uL (ref 150–400)
RBC: 4.57 MIL/uL (ref 4.22–5.81)
WBC: 7.7 10*3/uL (ref 4.0–10.5)

## 2013-03-17 LAB — GLUCOSE, CAPILLARY
Glucose-Capillary: 182 mg/dL — ABNORMAL HIGH (ref 70–99)
Glucose-Capillary: 282 mg/dL — ABNORMAL HIGH (ref 70–99)
Glucose-Capillary: 223 mg/dL — ABNORMAL HIGH (ref 70–99)

## 2013-03-17 LAB — PROTIME-INR: Prothrombin Time: 15.3 seconds — ABNORMAL HIGH (ref 11.6–15.2)

## 2013-03-17 MED ORDER — INSULIN ASPART 100 UNIT/ML ~~LOC~~ SOLN: 6.0000 [IU] | Freq: Three times a day (TID) | SUBCUTANEOUS | Status: DC

## 2013-03-17 MED ORDER — INSULIN GLARGINE 100 UNIT/ML ~~LOC~~ SOLN
22.0000 [IU] | Freq: Every day | SUBCUTANEOUS | Status: DC
  Administered 2013-03-17: 22 [IU] via SUBCUTANEOUS
  Filled 2013-03-17: qty 0.22

## 2013-03-17 MED ORDER — WARFARIN SODIUM 2.5 MG PO TABS
12.5000 mg | ORAL_TABLET | Freq: Once | ORAL | Status: AC
  Administered 2013-03-17: 12.5 mg via ORAL
  Filled 2013-03-17: qty 1

## 2013-03-17 NOTE — Progress Notes (Signed)
ANTICOAGULATION CONSULT NOTE - Follow Up Consult  Pharmacy Consult for Heparin->Lovenox, Coumadin Indication: pulmonary embolus and DVT, left lower leg  Allergies  Allergen Reactions  . Lipitor [Atorvastatin] Other (See Comments)    Fatigue  . Losartan Nausea And Vomiting, Other (See Comments) and Cough    Dizziness   . Lyrica [Pregabalin] Other (See Comments)    blindness  . Prednisone Swelling    Legs swell    Patient Measurements: Height: 6\' 4"  (193 cm) Weight: 272 lb 0.8 oz (123.4 kg) IBW/kg (Calculated) : 86.8  Vital Signs: Temp: 98.5 F (36.9 C) (08/15 1300) Temp src: Oral (08/15 1300) BP: 139/93 mmHg (08/15 1300) Pulse Rate: 82 (08/15 1446)  Labs:  Recent Labs  03/15/13 0200 03/15/13 0950 03/15/13 1742 03/16/13 0430 03/17/13 0500  HGB 12.5*  --   --  12.8* 13.5  HCT 35.3*  --   --  36.2* 38.8*  PLT 145*  --   --  163 172  LABPROT  --   --   --  13.8 15.3*  INR  --   --   --  1.08 1.24  HEPARINUNFRC 1.09* 0.72* 0.54 0.52  --   CREATININE  --   --   --  0.98  --     Estimated Creatinine Clearance: 135.1 ml/min (by C-G formula based on Cr of 0.98).  Assessment:    After 3 days of IV heparin, transitioned to SQ Lovenox on 8/14 am.  Coumadin begun on 8/13 with 10 mg and repeated 8/14.  Day # 3 overlap.  INR rising slowly.    Anti-Xa level 4 hrs after this morning's Lovenox 130 mg dose is 0.65. Therapeutic.  Extrapolates to 0.6  for 120 mg dose (syringe size), lower limit of therapeutic range. Using 150 mg syringes to provide 130 mg dose. Discussed briefly with Dr. Malachi Bonds.   Goal of Therapy:  INR 2-3 Anti-Xa level 0.6-1.2 units/ml 4hrs after LMWH dose given Monitor platelets by anticoagulation protocol: Yes   Plan:   Continue Lovenox 130 mg SQ q12hrs while in the hospital.  Increase Coumadin 12.5 mg today.  Daily PT/INR.  CBC at least q72hrs.  Could decrease Lovenox to 120 mg sq Q12hrs if to be discharged before INR >2 x 2 consecutive days.   [name], [place] Pager: [phone] 03/17/2013,4:29 PM

## 2013-03-17 NOTE — Evaluation (Signed)
Physical Therapy Evaluation Patient Details Name: Brian Parks MRN: [id number] DOB: 22-Aug-1965 Today's Date: 03/17/2013 Time: 4098-1191 PT Time Calculation (min): 17 min  PT Assessment / Plan / Recommendation History of Present Illness  LLE DVE and saddle PE  Clinical Impression  Pt at baseline functional level with equal step length despite report of LLE pain. Pt with sats 90-92% on RA with gait. Pt without further needs at this time and agreeable to no further therapy needs. Recommend daily ambulation. Will sign off.    PT Assessment  Patent does not need any further PT services    Follow Up Recommendations  No PT follow up    Does the patient have the potential to tolerate intense rehabilitation      Barriers to Discharge        Equipment Recommendations  None recommended by PT    Recommendations for Other Services     Frequency      Precautions / Restrictions Precautions Precautions: None   Pertinent Vitals/Pain 7/10 LLE pain, RN notified      Mobility  Bed Mobility Bed Mobility: Supine to Sit;Sit to Supine Supine to Sit: 7: Independent;HOB flat Sit to Supine: 7: Independent;HOB flat Transfers Transfers: Sit to Stand;Stand to Sit Sit to Stand: 7: Independent;From bed Stand to Sit: 7: Independent;To bed Ambulation/Gait Ambulation/Gait Assistance: 7: Independent Ambulation Distance (Feet): 400 Feet Assistive device: None Gait Pattern: Within Functional Limits Gait velocity: WFL Stairs: Yes Stairs Assistance: 7: Independent Stair Management Technique: Forwards Number of Stairs: 4    Exercises     PT Diagnosis:    PT Problem List:   PT Treatment Interventions:       PT Goals(Current goals can be found in the care plan section) Acute Rehab PT Goals PT Goal Formulation: No goals set, d/c therapy  Visit Information  Last PT Received On: 03/17/13 Assistance Needed: +1 History of Present Illness: LLE DVE and saddle PE       Prior Functioning  Home Living Family/patient expects to be discharged to:: Private residence Living Arrangements: Children Available Help at Discharge: Family;Available PRN/intermittently Type of Home: House Home Access: Stairs to enter [employer] of Steps: 4 Home Layout: One level Home Equipment: Cane - single point Prior Function Level of Independence: Independent Communication Communication: No difficulties    Cognition  Cognition Arousal/Alertness: Awake/alert Behavior During Therapy: WFL for tasks assessed/performed Overall Cognitive Status: Within Functional Limits for tasks assessed    Extremity/Trunk Assessment Upper Extremity Assessment Upper Extremity Assessment: Overall WFL for tasks assessed Lower Extremity Assessment Lower Extremity Assessment: Overall WFL for tasks assessed Cervical / Trunk Assessment Cervical / Trunk Assessment: Normal   Balance    End of Session PT - End of Session Activity Tolerance: Patient tolerated treatment well Patient left: in bed Nurse Communication: Mobility status  GP     Delorse Lek 03/17/2013, 7:43 AM  Delaney Meigs, PT [phone]

## 2013-03-17 NOTE — Progress Notes (Signed)
Pt. Refused cpap. RT informed pt. To notify if he changes his mind. 

## 2013-03-17 NOTE — Progress Notes (Signed)
[facility] PROGRESS NOTE  Brian Parks:[id number] DOB: February 28, 1966 DOA: 03/13/2013 PCP: Warrick Parisian, MD  Assessment/Plan  Large saddle pulmonary embolism, currently on room air and not dyspneic, and DVT.   -  Continue lovenox with dosing by anti-Xa levels (Appreciate pharm assistance) -  Continue coumadin per pharmacy -  ECHO with PA pressure of and no evidence of right heart strain -  Anticipate life long anticoagulation -  Patient seems comfortable with lovenox injections at home, however, he cannot afford the medication and is staying until he can afford the copay (<$100) -  Hypercoagulable panel:  Protein C, protein S elevated.  Repeat panel after off heparin/lovenox and clot burden reduced.  Lupus anticoag NEG, homocysteine wnl, factor 5 leiden neg, AT III wnl.  Beta-2 glycoprotein igM 22 (mildly elevated), cardiolipin ab neg -  FHx of lupus:  Send ANA with AML -  F/u with PCP regarding malignancy screening  Grade 1 diastolic dysfunction:  Strict I/O and monitor daily weights  Tachycardia, resolving, likely secondary to pulmonary embolism.  T2DM, fingersticks still elevated -  Increase lantus to 22 units -  Continue high dose SSI and add aspart 6 units with meals -  Continue HS insulin  HTN with mildly elevated blood pressures -  Start HCTZ  Chronic back pain and now LLE pain due to DVT -  Weaned to twice daily oxycontin with PRN medication prior to ambulation with patient's approval  Constipation: -  Add colace, senna, miralax, and bisacodyl prn  Mild hyponatremia, repeat as outpatient Mild normocytic anemia, likely due to PE.  No evidence of bleeding.  Repeat as outpatient.   Diet:  Heart Access:  PIV IVF:  Off Proph:  Therapeutic Lovenox  Code Status: Full Family Communication: Patient alone Disposition Plan: Home once able to afford copay.  Patient has followup appointment scheduled with primary care doctor for Monday for INR check.      Consultants:  PCCM  Diabetic educator  SIGNIFICANT EVENTS / STUDIES:  outpt duplex pos left lower ext  8/11 - admit with PE / DVT  8/11 CTA chest>>>1. Examination is positive for saddle pulmonary embolism with large bilateral occlusive or nonocclusive clot burden. No definitive evidence of pulmonary infarction. While there is enlargement of the caliber of the main pulmonary artery, there is no definite CT evidence of right-sided heart strain. Further evaluation with cardiac echo may be performed as clinically Indicated. 2. Possible hepatic steatosis.  8/11 2D echo>>> EF50-55%, grade 1 diastolic dysfunction, PA pressure , normal RV   Antibiotics:  None   HPI/Subjective:  Able to ambulate without significant dyspnea.  Has been coughing up green mucus.  Eating well. No recent bowel movements. Has persistent pain in the left leg.  Objective: Filed Vitals:   03/17/13 0507 03/17/13 0741 03/17/13 1300 03/17/13 1446  BP: 130/86  139/93   Pulse: 99 108 85 82  Temp: 98.7 F (37.1 C)  98.5 F (36.9 C)   TempSrc: Oral  Oral   Resp: 18  18   Height:      Weight: 123.4 kg (272 lb 0.8 oz)     SpO2: 93% 90% 97% 92%   No intake or output data in the 24 hours ending 03/17/13 1745 Filed Weights   03/14/13 0459 03/16/13 0443 03/17/13 0507  Weight: 124.739 kg (275 lb) 125.193 kg (276 lb) 123.4 kg (272 lb 0.8 oz)    Exam:   General:  Obese Caucasian male,  No acute distress  HEENT:  NCAT, MMM  Cardiovascular:  RRR, nl S1, S2 no mrg, 2+ pulses, warm extremities  Respiratory:  CTAB, no increased WOB  Abdomen:  NABS, soft, NT/ND  MSK:  Normal tone and bulk, 1+ left LEE with TTP and mild injection.    Neuro:  Grossly intact  Data Reviewed: Basic Metabolic Panel:  Recent Labs Lab 03/13/13 1409 03/14/13 0852 03/16/13 0430  NA 136 135 134*  K 3.9 3.9 3.7  CL 104 101 99  CO2  --  25 23  GLUCOSE 292* 224* 197*  BUN 18 14 10   CREATININE 1.10 0.95 0.98   CALCIUM  --  8.8 9.5   Liver Function Tests: No results found for this basename: AST, ALT, ALKPHOS, BILITOT, PROT, ALBUMIN,  in the last 168 hours No results found for this basename: LIPASE, AMYLASE,  in the last 168 hours No results found for this basename: AMMONIA,  in the last 168 hours CBC:  Recent Labs Lab 03/13/13 1340 03/13/13 1409 03/14/13 0852 03/15/13 0200 03/16/13 0430 03/17/13 0500  WBC 9.6  --  7.6 8.3 8.3 7.7  HGB 14.6 15.0 12.7* 12.5* 12.8* 13.5  HCT 40.8 44.0 36.8* 35.3* 36.2* 38.8*  MCV 85.7  --  86.6 85.5 85.2 84.9  PLT 129*  --  124* 145* 163 172   Cardiac Enzymes: No results found for this basename: CKTOTAL, CKMB, CKMBINDEX, TROPONINI,  in the last 168 hours BNP (last 3 results) No results found for this basename: PROBNP,  in the last 8760 hours CBG:  Recent Labs Lab 03/16/13 1646 03/16/13 2128 03/17/13 0620 03/17/13 1127 03/17/13 1612  GLUCAP 230* 186* 194* 182* 282*    Recent Results (from the past 240 hour(s))  MRSA PCR SCREENING     Status: None   Collection Time    03/13/13  7:12 PM      Result Value Range Status   MRSA by PCR NEGATIVE  NEGATIVE Final   Comment:            The GeneXpert MRSA Assay (FDA     approved for NASAL specimens     only), is one component of a     comprehensive MRSA colonization     surveillance program. It is not     intended to diagnose MRSA     infection nor to guide or     monitor treatment for     MRSA infections.     Studies: No results found.  Scheduled Meds: . colesevelam  1,875 mg Oral BID WC  . docusate sodium  100 mg Oral BID  . enoxaparin (LOVENOX) injection  130 mg Subcutaneous Q12H  . enoxaparin   Does not apply Once  . fenofibrate  160 mg Oral QHS  . hydrochlorothiazide  12.5 mg Oral Daily  . insulin aspart  0-20 Units Subcutaneous TID WC  . insulin aspart  0-5 Units Subcutaneous QHS  . [START ON 03/18/2013] insulin aspart  6 Units Subcutaneous TID WC  . insulin glargine  22 Units  Subcutaneous QHS  . OxyCODONE  40 mg Oral Q12H  . polyethylene glycol  17 g Oral Daily  . senna  2 tablet Oral QHS  . warfarin  12.5 mg Oral ONCE-1800  . warfarin   Does not apply Once  . Warfarin - Pharmacist Dosing Inpatient   Does not apply q1800   Continuous Infusions:   Active Problems:   Pulmonary emboli   DVT (deep venous thrombosis)   Type II or unspecified type  diabetes mellitus with unspecified complication, uncontrolled   Elevated blood pressure   Normocytic anemia   Unspecified constipation   Narcotic dependence    Time spent: 30 min    Triniti Gruetzmacher  [facility] Pager [phone]. If 7PM-7AM, please contact night-coverage at [email or web address], password [facility] 03/17/2013, 5:45 PM  LOS: 4 days

## 2013-03-18 LAB — CBC
HCT: 38.5 % — ABNORMAL LOW (ref 39.0–52.0)
Hemoglobin: 13.9 g/dL (ref 13.0–17.0)
RBC: 4.55 MIL/uL (ref 4.22–5.81)

## 2013-03-18 LAB — PROTIME-INR
INR: 1.68 — ABNORMAL HIGH (ref 0.00–1.49)
Prothrombin Time: 19.3 seconds — ABNORMAL HIGH (ref 11.6–15.2)

## 2013-03-18 LAB — GLUCOSE, CAPILLARY
Glucose-Capillary: 258 mg/dL — ABNORMAL HIGH (ref 70–99)
Glucose-Capillary: 228 mg/dL — ABNORMAL HIGH (ref 70–99)
Glucose-Capillary: 212 mg/dL — ABNORMAL HIGH (ref 70–99)

## 2013-03-18 MED ORDER — METFORMIN HCL 500 MG PO TABS
1000.0000 mg | ORAL_TABLET | Freq: Two times a day (BID) | ORAL | Status: DC
  Administered 2013-03-18 – 2013-03-19 (×2): 1000 mg via ORAL
  Filled 2013-03-18 (×4): qty 2

## 2013-03-18 MED ORDER — INSULIN GLARGINE 100 UNIT/ML ~~LOC~~ SOLN
10.0000 [IU] | Freq: Once | SUBCUTANEOUS | Status: AC
  Administered 2013-03-18: 10 [IU] via SUBCUTANEOUS
  Filled 2013-03-18: qty 0.1

## 2013-03-18 MED ORDER — INSULIN ASPART 100 UNIT/ML ~~LOC~~ SOLN
8.0000 [IU] | Freq: Three times a day (TID) | SUBCUTANEOUS | Status: DC
  Administered 2013-03-18 – 2013-03-19 (×4): 8 [IU] via SUBCUTANEOUS

## 2013-03-18 MED ORDER — WARFARIN SODIUM 2.5 MG PO TABS
12.5000 mg | ORAL_TABLET | Freq: Once | ORAL | Status: AC
  Administered 2013-03-18: 12.5 mg via ORAL
  Filled 2013-03-18: qty 1

## 2013-03-18 MED ORDER — INSULIN GLARGINE 100 UNIT/ML ~~LOC~~ SOLN
30.0000 [IU] | Freq: Every day | SUBCUTANEOUS | Status: DC
  Administered 2013-03-18: 30 [IU] via SUBCUTANEOUS
  Filled 2013-03-18 (×2): qty 0.3

## 2013-03-18 NOTE — Progress Notes (Signed)
ANTICOAGULATION CONSULT NOTE - Follow Up Consult  Pharmacy Consult for Lovenox, Coumadin Indication: pulmonary embolus and DVT, left lower leg  Allergies  Allergen Reactions  . Lipitor [Atorvastatin] Other (See Comments)    Fatigue  . Losartan Nausea And Vomiting, Other (See Comments) and Cough    Dizziness   . Lyrica [Pregabalin] Other (See Comments)    blindness  . Prednisone Swelling    Legs swell    Patient Measurements: Height: 6\' 4"  (193 cm) Weight: 270 lb 9.6 oz (122.743 kg) IBW/kg (Calculated) : 86.8  Vital Signs: Temp: 98.4 F (36.9 C) (08/16 1301) Temp src: Oral (08/16 1301) BP: 133/86 mmHg (08/16 1301) Pulse Rate: 91 (08/16 1301)  Labs:  Recent Labs  03/15/13 1742  03/16/13 0430 03/17/13 0500 03/18/13 0500  HGB  --   < > 12.8* 13.5 13.9  HCT  --   --  36.2* 38.8* 38.5*  PLT  --   --  163 172 184  LABPROT  --   --  13.8 15.3* 19.3*  INR  --   --  1.08 1.24 1.68*  HEPARINUNFRC 0.54  --  0.52  --   --   CREATININE  --   --  0.98  --   --   < > = values in this interval not displayed.  Estimated Creatinine Clearance: 134.8 ml/min (by C-G formula based on Cr of 0.98).  Assessment:    After 3 days of IV heparin, transitioned to SQ Lovenox on 8/14 am.  Day # 4 overlap with Coumadin.  INR now increasing towards goal after Coumadin dose increased to 12.5 mg yesterday.  CBC stable.    Thereapeutic anti-Xa level (0.65) 4 hrs after Lovenox 130 mg dose on 8/15.  Goal of Therapy:  INR 2-3 Anti-Xa level 0.6-1.2 units/ml 4hrs after LMWH dose given Monitor platelets by anticoagulation protocol: Yes   Plan:   Continue Lovenox 130 mg SQ q12hrs while in the hospital.  Repeat Coumadin 12.5 mg today.  Daily PT/INR.  CBC at least q72hrs.  Could decrease Lovenox to 120 mg sq Q12hrs if to be discharged before INR >2 x 2 consecutive days.   [name], RPh Pager: [phone] 03/18/2013,2:18 PM

## 2013-03-18 NOTE — Progress Notes (Signed)
[facility] PROGRESS NOTE  Brian Parks ZOX:[id number] DOB: Dec 12, 1965 DOA: 03/13/2013 PCP: Warrick Parisian, MD  Assessment/Plan  Large saddle pulmonary embolism, currently on room air and not dyspneic, and DVT.   -  Continue lovenox with dosing by anti-Xa levels (Appreciate pharm assistance) -  Continue coumadin per pharmacy -  ECHO with PA pressure of and no evidence of right heart strain -  Anticipate life long anticoagulation -  Patient seems comfortable with lovenox injections at home, however, he cannot afford the medication and is staying until he can afford the copay (<$100) -  Hypercoagulable panel:  Protein C, protein S elevated.  Repeat panel after off heparin/lovenox and clot burden reduced.  Lupus anticoag NEG, homocysteine wnl, factor 5 leiden neg, AT III wnl.  Beta-2 glycoprotein igM 22 (mildly elevated), cardiolipin ab neg -  FHx of lupus:  ANA pending -  F/u with PCP regarding malignancy screening  Grade 1 diastolic dysfunction:  Strict I/O and monitor daily weights -  Weights trending down  Tachycardia, resolving, likely secondary to pulmonary embolism. -  D/c telemetry  T2DM, fingersticks still elevated -  Increase lantus to 30 units -  Continue high dose SSI and increase to aspart 8 units with meals -  Continue HS insulin -  Restart metformin  HTN, BP trending down after starting HCTZ -  Adjust HCTZ dose prn  Chronic back pain and now LLE pain due to DVT -  Weaned to twice daily oxycontin with PRN medication prior to ambulation with patient's approval  Constipation: -  Continue colace, senna, miralax, and bisacodyl prn  Mild hyponatremia, repeat as outpatient Mild normocytic anemia, likely due to PE.  No evidence of bleeding.  Repeat as outpatient.   Diet:  Heart Access:  PIV IVF:  Off Proph:  Therapeutic Lovenox  Code Status: Full Family Communication: Patient alone Disposition Plan: Home once able to afford copay, possibly on  Monday.  Patient has followup appointment scheduled with primary care doctor for Monday for INR check, but may need to reschedule.     Consultants:  PCCM  Diabetic educator  SIGNIFICANT EVENTS / STUDIES:  outpt duplex pos left lower ext  8/11 - admit with PE / DVT  8/11 CTA chest>>>1. Examination is positive for saddle pulmonary embolism with large bilateral occlusive or nonocclusive clot burden. No definitive evidence of pulmonary infarction. While there is enlargement of the caliber of the main pulmonary artery, there is no definite CT evidence of right-sided heart strain. Further evaluation with cardiac echo may be performed as clinically Indicated. 2. Possible hepatic steatosis.  8/11 2D echo>>> EF50-55%, grade 1 diastolic dysfunction, PA pressure , normal RV   Antibiotics:  None   HPI/Subjective:  Able to ambulate without significant dyspnea, but does exacerbate pain in left leg.  Has been coughing up less mucus.  Eating well. No recent bowel movements.  Objective: Filed Vitals:   03/17/13 1300 03/17/13 1446 03/17/13 2106 03/18/13 0525  BP: 139/93  152/89 105/69  Pulse: 85 82 99 102  Temp: 98.5 F (36.9 C)  99.1 F (37.3 C) 98.4 F (36.9 C)  TempSrc: Oral  Oral Oral  Resp: 18  18 20   Height:      Weight:    122.743 kg (270 lb 9.6 oz)  SpO2: 97% 92% 95% 92%    Intake/Output Summary (Last 24 hours) at 03/18/13 0916 Last data filed at 03/18/13 0600  Gross per 24 hour  Intake   1500 ml  Output  0 ml  Net   1500 ml   Filed Weights   03/16/13 0443 03/17/13 0507 03/18/13 0525  Weight: 125.193 kg (276 lb) 123.4 kg (272 lb 0.8 oz) 122.743 kg (270 lb 9.6 oz)    Exam:   General:  Obese Caucasian male,  No acute distress  HEENT:  NCAT, MMM  Cardiovascular:  RRR, nl S1, S2 no mrg, 2+ pulses, warm extremities  Respiratory:  CTAB, no increased WOB  Abdomen:  NABS, soft, NT/ND  MSK:  Normal tone and bulk, 1+ left LEE with TTP  Neuro:  Grossly  intact  Data Reviewed: Basic Metabolic Panel:  Recent Labs Lab 03/13/13 1409 03/14/13 0852 03/16/13 0430  NA 136 135 134*  K 3.9 3.9 3.7  CL 104 101 99  CO2  --  25 23  GLUCOSE 292* 224* 197*  BUN 18 14 10   CREATININE 1.10 0.95 0.98  CALCIUM  --  8.8 9.5   Liver Function Tests: No results found for this basename: AST, ALT, ALKPHOS, BILITOT, PROT, ALBUMIN,  in the last 168 hours No results found for this basename: LIPASE, AMYLASE,  in the last 168 hours No results found for this basename: AMMONIA,  in the last 168 hours CBC:  Recent Labs Lab 03/14/13 0852 03/15/13 0200 03/16/13 0430 03/17/13 0500 03/18/13 0500  WBC 7.6 8.3 8.3 7.7 8.4  HGB 12.7* 12.5* 12.8* 13.5 13.9  HCT 36.8* 35.3* 36.2* 38.8* 38.5*  MCV 86.6 85.5 85.2 84.9 84.6  PLT 124* 145* 163 172 184   Cardiac Enzymes: No results found for this basename: CKTOTAL, CKMB, CKMBINDEX, TROPONINI,  in the last 168 hours BNP (last 3 results) No results found for this basename: PROBNP,  in the last 8760 hours CBG:  Recent Labs Lab 03/17/13 0620 03/17/13 1127 03/17/13 1612 03/17/13 2104 03/18/13 0606  GLUCAP 194* 182* 282* 223* 258*    Recent Results (from the past 240 hour(s))  MRSA PCR SCREENING     Status: None   Collection Time    03/13/13  7:12 PM      Result Value Range Status   MRSA by PCR NEGATIVE  NEGATIVE Final   Comment:            The GeneXpert MRSA Assay (FDA     approved for NASAL specimens     only), is one component of a     comprehensive MRSA colonization     surveillance program. It is not     intended to diagnose MRSA     infection nor to guide or     monitor treatment for     MRSA infections.     Studies: No results found.  Scheduled Meds: . colesevelam  1,875 mg Oral BID WC  . docusate sodium  100 mg Oral BID  . enoxaparin (LOVENOX) injection  130 mg Subcutaneous Q12H  . enoxaparin   Does not apply Once  . fenofibrate  160 mg Oral QHS  . hydrochlorothiazide  12.5 mg  Oral Daily  . insulin aspart  0-20 Units Subcutaneous TID WC  . insulin aspart  0-5 Units Subcutaneous QHS  . insulin aspart  8 Units Subcutaneous TID WC  . insulin glargine  30 Units Subcutaneous QHS  . OxyCODONE  40 mg Oral Q12H  . polyethylene glycol  17 g Oral Daily  . senna  2 tablet Oral QHS  . warfarin   Does not apply Once  . Warfarin - Pharmacist Dosing Inpatient   Does not  apply q1800   Continuous Infusions:   Active Problems:   Pulmonary emboli   DVT (deep venous thrombosis)   Type II or unspecified type diabetes mellitus with unspecified complication, uncontrolled   Elevated blood pressure   Normocytic anemia   Unspecified constipation   Narcotic dependence    Time spent: 30 min    Brian Parks  [facility] Pager [phone]. If 7PM-7AM, please contact night-coverage at [email or web address], password [facility] 03/18/2013, 9:16 AM  LOS: 5 days

## 2013-03-19 MED ORDER — OXYCODONE HCL 40 MG PO TB12: 40.0000 mg | ORAL_TABLET | Freq: Two times a day (BID) | ORAL | Status: DC

## 2013-03-19 MED ORDER — OXYCODONE HCL 10 MG PO TABS: 10.0000 mg | ORAL_TABLET | Freq: Four times a day (QID) | ORAL | Status: AC | PRN

## 2013-03-19 MED ORDER — WARFARIN SODIUM 2.5 MG PO TABS
12.5000 mg | ORAL_TABLET | Freq: Once | ORAL | Status: AC
  Administered 2013-03-19: 12.5 mg via ORAL
  Filled 2013-03-19: qty 1

## 2013-03-19 MED ORDER — WARFARIN SODIUM 5 MG PO TABS: 10.0000 mg | ORAL_TABLET | Freq: Every evening | ORAL | Status: DC

## 2013-03-19 MED ORDER — ENOXAPARIN SODIUM 120 MG/0.8ML ~~LOC~~ SOLN: 120.0000 mg | Freq: Two times a day (BID) | SUBCUTANEOUS | Status: DC

## 2013-03-19 MED ORDER — HYDROCHLOROTHIAZIDE 12.5 MG PO CAPS: 12.5000 mg | ORAL_CAPSULE | Freq: Every day | ORAL | Status: DC

## 2013-03-19 NOTE — Discharge Summary (Signed)
Physician Discharge Summary  Brian Parks ZOX:[id number] DOB: 08-22-1965 DOA: 03/13/2013  PCP: Warrick Parisian, MD  Admit date: 03/13/2013 Discharge date: 03/19/2013  Recommendations for Outpatient Follow-up:  1. Follow up with primary care doctor on 8/18 for INR and instructions for warfarin/lovenox. 2. Continue lifelong a/c 3. Follow up on pending ANA.  Further evaluation for hypercoagulable disorder once stable on coumadin. 4. Continue to wean long acting narcotics, currently weaned to twice daily.  Given Rx for oxycodone 10mg  to use as needed prior to ambulation.  #20. 5. Blood pressure check after starting HCTZ (patient had stopped ARB)  Discharge Diagnoses:  Active Problems:   Pulmonary emboli   DVT (deep venous thrombosis)   Type II or unspecified type diabetes mellitus with unspecified complication, uncontrolled   Elevated blood pressure   Normocytic anemia   Unspecified constipation   Narcotic dependence   Discharge Condition: stable, improved  Diet recommendation: diabetic  Wt Readings from Last 3 Encounters:  03/19/13 122.199 kg (269 lb 6.4 oz)    History of present illness:  47 y/o M with PMH of HTN, HLD, DM, chronic back pain s/p nerve stimulator who presented to [facility] ER 8/11 from PCP (Dr. Suann Larry) with positive LE DVT - reported LE swelling and claudication. Patient notes he has been having 1 wk hx of dyspnea on exertion that was initially intermittent. Notes dry cough, lightheadedness, and near syncope. Denies fever, chills, chest pain, chest pain with inspiration, hemoptysis. Evaluation in ER demonstrated CT with large saddle emboli. Reports mother with history of lupus.   Hospital Course:   Large saddle pulmonary embolism, currently on room air and not dyspneic, and LLE DVT.  He was admitted to the ICU and placed on heparin gtt.  Pulmonology recommended higher goal of heparin level due to severity of pulmonary embolism.  Pro-BNP and troponin were not done,  however, there was no evidence of right heart dilation on CTa and ECHO demonstrated PA pressure of and no evidence of right heart strain.  After 3 days on heparin gtt, he was transitioned to lovenox twice daily dosing with anti-Xa levels and started on coumadin, dosed by pharmacy.  His INR on the day of discharge was 1.94.  He was able to afford approximately 3 days of lovenox injections but we anticipate he will only need approximately 2 more days of lovenox at this time.  His INR will be checked the day after admission.  Please continue lovenox for 24 hours after INR is therapeutic.    - Hypercoagulable panel: Protein C, protein S elevated. Repeat panel after off heparin/lovenox and clot burden reduced. Lupus anticoag NEG, homocysteine wnl, factor 5 leiden neg, AT III wnl. Beta-2 glycoprotein igM 22 (mildly elevated), cardiolipin ab neg  - FHx of lupus: ANA pending  - F/u with PCP regarding malignancy screening   Grade 1 diastolic dysfunction: Euvolemic.  Tachycardia, resolving, likely secondary to pulmonary embolism. Trended down.    T2DM, fingersticks elevated initially because insulin was decreased in the setting of acute illness to avoid hypoglycemia.  He should resume his previous regimen.     HTN, BP trending down after starting HCTZ.    Chronic back pain and now LLE pain due to DVT.  Weaned to twice daily oxycontin with PRN medication prior to ambulation with patient's approval.  Constipation, resolved with colace, senna, miralax, and bisacodyl prn.  Mild hyponatremia, likely due to acute illness, repeat as outpatient.    Mild normocytic anemia, likely due to PE. No evidence  of bleeding. Repeat as outpatient.    Consultants:  PCCM  Diabetic educator  SIGNIFICANT EVENTS / STUDIES:  8/11 lower extremity venous duplex: pos left lower ext DVT 8/11 - admit with PE / DVT  8/11 CTA chest . Examination is positive for saddle pulmonary embolism with large bilateral occlusive or  nonocclusive clot burden. No definitive evidence of pulmonary infarction. While there is enlargement of the caliber of the main pulmonary artery, there is no definite CT evidence of right-sided heart strain. Further evaluation with cardiac echo may be performed as clinically Indicated. 2. Possible hepatic steatosis.  8/11 2D echo EF50-55%, grade 1 diastolic dysfunction, PA pressure , normal RV   Antibiotics:  None    Discharge Exam: Filed Vitals:   03/19/13 0544  BP: 131/85  Pulse: 81  Temp: 98.8 F (37.1 C)  Resp: 18   Filed Vitals:   03/18/13 0525 03/18/13 1301 03/18/13 1946 03/19/13 0544  BP: 105/69 133/86 137/93 131/85  Pulse: 102 91 88 81  Temp: 98.4 F (36.9 C) 98.4 F (36.9 C) 99 F (37.2 C) 98.8 F (37.1 C)  TempSrc: Oral Oral Oral Oral  Resp: 20 16 18 18   Height:      Weight: 122.743 kg (270 lb 9.6 oz)   122.199 kg (269 lb 6.4 oz)  SpO2: 92% 92% 94% 91%   LLE pain improving.  Able to ambulate with SOB.  General: Obese Caucasian male, No acute distress  HEENT: NCAT, MMM  Cardiovascular: RRR, nl S1, S2 no mrg, 2+ pulses, warm extremities  Respiratory: CTAB, no increased WOB  Abdomen: NABS, soft, NT/ND  MSK: Normal tone and bulk, 1+ left LEE with TTP, markedly improved from prior Neuro: Grossly intact   Discharge Instructions     Medication List    ASK your doctor about these medications       colesevelam 625 MG tablet  Commonly known as:  WELCHOL  Take 1,875 mg by mouth 2 (two) times daily with a meal.     fenofibrate 160 MG tablet  Take 160 mg by mouth at bedtime.     glimepiride 2 MG tablet  Commonly known as:  AMARYL  Take 2 mg by mouth 2 (two) times daily.     insulin lispro protamine-lispro (50-50) 100 UNIT/ML Susp injection  Commonly known as:  HUMALOG 50/50  Inject 20 Units into the skin 3 (three) times daily.     losartan 100 MG tablet  Commonly known as:  COZAAR  Take 100 mg by mouth daily.     metFORMIN 1000 MG tablet   Commonly known as:  GLUCOPHAGE  Take 1,000 mg by mouth 2 (two) times daily with a meal.     OMEGA-3 KRILL OIL PO  Take 1 capsule by mouth every morning.     oxyCODONE 40 MG 12 hr tablet  Commonly known as:  OXYCONTIN  Take 40 mg by mouth every 8 (eight) hours.     TRADJENTA 5 MG Tabs tablet  Generic drug:  linagliptin  Take 5 mg by mouth daily.           Follow-up Information   Follow up with Warrick Parisian, MD On 03/20/2013. (11 AM.  )    Specialty:  Family Medicine   Contact information:   [address] [place] [address] [phone]        The results of significant diagnostics from this hospitalization (including imaging, microbiology, ancillary and laboratory) are listed below for reference.  Significant Diagnostic Studies: Ct Angio Chest W/cm &/or Wo Cm  03/13/2013   *RADIOLOGY REPORT*  Clinical Data: History of lower extremity blood clot, now with shortness of breath, dizziness and cough, evaluate for pulmonary embolism  CT ANGIOGRAPHY CHEST  Technique:  Multidetector CT imaging of the chest using the standard protocol during bolus administration of intravenous contrast. Multiplanar reconstructed images including MIPs were obtained and reviewed to evaluate the vascular anatomy.  Contrast: OMNIPAQUE IOHEXOL 350 MG/ML SOLN  Comparison: Bilateral lower extremity venous Doppler ultrasound - earlier same day  Vascular Findings:  There is adequate opacification of the pulmonary arterial system of the main pulmonary artery measuring 299 HU.  There is saddle pulmonary emboli noted extending across the bifurcation of the main pulmonary artery with nonocclusive thrombus seen within both the right and left pulmonary arteries.  There is extension of mixed occlusive and nonocclusive thrombus into nearly all bilateral segmental pulmonary arteries, likely worse within the bilateral lower lobar pulmonary arteries, left greater than right.  There is mild enlargement the  caliber the main pulmonary artery measuring approximately 36 mm in diameter.  There is no definitive evidence of right-sided heart strain.  There is no reflux of administered intravenous contrast into the hepatic venous system. There is no definitive evidence of pulmonary infarction.  Normal heart size.  Coronary artery calcifications.  No pericardial effusion.  Normal caliber of the thoracic aorta.  Bovine configuration of the aortic arch.  No thoracic aortic dissection or periaortic stranding.  -------------------------------------------------------  Nonvascular findings:  Minimal bibasilar dependent ground-glass atelectasis.  No focal airspace opacities.  No definitive evidence of pulmonary infarction.  The central pulmonary airways are patent.  No pleural effusion or pneumothorax.  Scattered shoddy mediastinal lymph nodes are not enlarged by CT criteria.  No mediastinal, hilar or axillary lymphadenopathy.  Limited visualization of the upper abdomen suggests possible decreased attenuation of the hepatic parenchyma suggestive of hepatic steatosis.  No acute or aggressive osseous abnormalities.  A spinal stimulator is seen within the spinal canal of the lower thoracic spine.  IMPRESSION: 1.  Examination is positive for saddle pulmonary embolism with large bilateral occlusive or nonocclusive clot burden.  No definitive evidence of pulmonary infarction.  While there is enlargement of the caliber of the main pulmonary artery, there is no definite CT evidence of right-sided heart strain.  Further evaluation with cardiac echo may be performed as clinically indicated. 2.  Possible hepatic steatosis.  Above findings discussed with Dr. Rubin Payor at [phone].   Original Report Authenticated By: Tacey Ruiz, MD   US Venous Img Lower Unilateral Left  03/13/2013   *RADIOLOGY REPORT*  Clinical Data: But the left lower extremity swelling and pain  BILATERAL LOWER EXTREMITY VENOUS DUPLEX ULTRASOUND  Technique:  Gray-scale  sonography with graded compression, as well as color Doppler and duplex ultrasound, were performed to evaluate the deep venous system of both lower extremities from the level of the common femoral vein through the popliteal and proximal calf veins.  Spectral Doppler was utilized to evaluate flow at rest and with distal augmentation maneuvers.  Comparison:  None.  Findings:  There is a visualized thrombus beginning at the level of the mid superficial femoral vein and extending distally through the popliteal vein into the posterior tibial vein.  These portions of the deep venous system show absence of a normal phasicity and compressibility.  Common femoral vein, saphenous femoral junction, profunda femoral vein, and more proximal superficial femoral vein appear normal.  IMPRESSION: There  is evidence of acute deep venous thrombosis in the left lower extremity as described above.I gave this report to Dr. Chinita Greenland by phone at  12:46.   Original Report Authenticated By: Esperanza Heir, M.D.   Dg Chest Port 1 View  03/14/2013   *RADIOLOGY REPORT*  Clinical Data: Cough.  Airspace disease.  PORTABLE CHEST - 1 VIEW  Comparison: CT chest 03/13/2013.  Findings: Low volume chest with basilar atelectasis. Monitoring leads are projected over the chest.  Cardiopericardial silhouette is accentuated by low inspiratory volumes.  Crowding of pulmonary vasculature is present. No convincing evidence of pulmonary infarction in this patient with demonstrated pulmonary embolism. No airspace disease is present.  IMPRESSION: Low volume chest with basilar atelectasis.   Original Report Authenticated By: Andreas Newport, M.D.    Microbiology: Recent Results (from the past 240 hour(s))  MRSA PCR SCREENING     Status: None   Collection Time    03/13/13  7:12 PM      Result Value Range Status   MRSA by PCR NEGATIVE  NEGATIVE Final   Comment:            The GeneXpert MRSA Assay (FDA     approved for NASAL specimens     only), is  one component of a     comprehensive MRSA colonization     surveillance program. It is not     intended to diagnose MRSA     infection nor to guide or     monitor treatment for     MRSA infections.     Labs: Basic Metabolic Panel:  Recent Labs Lab 03/13/13 1409 03/14/13 0852 03/16/13 0430  NA 136 135 134*  K 3.9 3.9 3.7  CL 104 101 99  CO2  --  25 23  GLUCOSE 292* 224* 197*  BUN 18 14 10   CREATININE 1.10 0.95 0.98  CALCIUM  --  8.8 9.5   Liver Function Tests: No results found for this basename: AST, ALT, ALKPHOS, BILITOT, PROT, ALBUMIN,  in the last 168 hours No results found for this basename: LIPASE, AMYLASE,  in the last 168 hours No results found for this basename: AMMONIA,  in the last 168 hours CBC:  Recent Labs Lab 03/14/13 0852 03/15/13 0200 03/16/13 0430 03/17/13 0500 03/18/13 0500  WBC 7.6 8.3 8.3 7.7 8.4  HGB 12.7* 12.5* 12.8* 13.5 13.9  HCT 36.8* 35.3* 36.2* 38.8* 38.5*  MCV 86.6 85.5 85.2 84.9 84.6  PLT 124* 145* 163 172 184   Cardiac Enzymes: No results found for this basename: CKTOTAL, CKMB, CKMBINDEX, TROPONINI,  in the last 168 hours BNP: BNP (last 3 results) No results found for this basename: PROBNP,  in the last 8760 hours CBG:  Recent Labs Lab 03/18/13 0606 03/18/13 1110 03/18/13 1640 03/18/13 2055 03/19/13 0555  GLUCAP 258* 228* 212* 172* 226*    Time coordinating discharge: 45 minutes  Signed:  Jaylina Ramdass  [facility] 03/19/2013, 11:05 AM

## 2013-03-20 LAB — ANA: Anti Nuclear Antibody(ANA): NEGATIVE

## 2013-08-26 ENCOUNTER — Emergency Department (HOSPITAL_COMMUNITY)
Admission: EM | Admit: 2013-08-26 | Discharge: 2013-08-26 | Disposition: A | Discharge status: Home / Self Care | Payer: Medicare Other | Source: Home / Self Care | Attending: Emergency Medicine | Admitting: Emergency Medicine

## 2013-08-26 ENCOUNTER — Encounter (HOSPITAL_COMMUNITY): Payer: Self-pay | Admitting: Emergency Medicine

## 2013-08-26 DIAGNOSIS — J069 Acute upper respiratory infection, unspecified: Secondary | ICD-10-CM

## 2013-08-26 MED ORDER — IPRATROPIUM BROMIDE 0.06 % NA SOLN: 2.0000 | Freq: Four times a day (QID) | NASAL | Status: AC

## 2013-08-26 MED ORDER — GUAIFENESIN-CODEINE 100-10 MG/5ML PO SYRP: 10.0000 mL | ORAL_SOLUTION | Freq: Four times a day (QID) | ORAL | Status: DC | PRN

## 2013-08-26 NOTE — Discharge Instructions (Signed)

## 2013-08-26 NOTE — ED Provider Notes (Signed)
  Chief Complaint   Chief Complaint  Patient presents with  . URI    History of Present Illness   Edsel PetrinRobert R Durocher is a 48 year old male who's had a five-day history of dry cough, nasal congestion with clear rhinorrhea, and sore throat. He denies any fever, chills, wheezing, chest pain, headache, or GI symptoms.  Review of Systems   Other than as noted above, the patient denies any of the following symptoms: Systemic:  No fevers, chills, sweats, or myalgias. Eye:  No redness or discharge. ENT:  No ear pain, headache, nasal congestion, drainage, sinus pressure, or sore throat. Neck:  No neck pain, stiffness, or swollen glands. Lungs:  No cough, sputum production, hemoptysis, wheezing, chest tightness, shortness of breath or chest pain. GI:  No abdominal pain, nausea, vomiting or diarrhea.  PMFSH   Past medical history, family history, social history, meds, and allergies were reviewed. He has a history of DVT, diabetes, hypertension, and elevated triglycerides. He is intolerant to prednisone. Current meds include glimepiride, metformin, we will call, fenofibrate, hydrochlorothiazide, Humalog 50/50, Tradjenta, and Coumadin.  Physical exam   Vital signs:  BP 145/90  Pulse 115  Temp(Src) 98.5 F (36.9 C) (Oral)  Resp 20  SpO2 94% General:  Alert and oriented.  In no distress.  Skin warm and dry. Eye:  No conjunctival injection or drainage. Lids were normal. ENT:  TMs and canals were normal, without erythema or inflammation.  Nasal mucosa was clear and uncongested, without drainage.  Mucous membranes were moist.  Pharynx was clear with no exudate or drainage.  There were no oral ulcerations or lesions. Neck:  Supple, no adenopathy, tenderness or mass. Lungs:  No respiratory distress.  Lungs were clear to auscultation, without wheezes, rales or rhonchi.  Breath sounds were clear and equal bilaterally.  Heart:  Regular rhythm, without gallops, murmers or rubs. Skin:  Clear, warm, and  dry, without rash or lesions.  Assessment     The encounter diagnosis was Viral upper respiratory illness.  No indication for antibiotics.  Plan    1.  Meds:  The following meds were prescribed:   Discharge Medication List as of 08/26/2013  1:43 PM    START taking these medications   Details  guaiFENesin-codeine (GUIATUSS AC) 100-10 MG/5ML syrup Take 10 mLs by mouth 4 (four) times daily as needed for cough., Starting 08/26/2013, Until Discontinued, Print    ipratropium (ATROVENT) 0.06 % nasal spray Place 2 sprays into both nostrils 4 (four) times daily., Starting 08/26/2013, Until Discontinued, Normal        2.  Patient Education/Counseling:  The patient was given appropriate handouts, self care instructions, and instructed in symptomatic relief.  Instructed to get extra fluids, rest, and use a cool mist vaporizer.    3.  Follow up:  The patient was told to follow up here if no better in 3 to 4 days, or sooner if becoming worse in any way, and given some red flag symptoms such as increasing fever, difficulty breathing, chest pain, or persistent vomiting which would prompt immediate return.  Follow up here as needed.      Reuben Likesavid C Tranika Scholler, MD 08/26/13 [phone]

## 2013-08-26 NOTE — ED Notes (Signed)
Pt c/o cold sxs onset Tuesday Sxs include: productive cough, runny nose, wheezing Denies: f/v/n/d, SOB Taking OTC cold meds w/no relief Hx of DM... He is alert w/no signs of acute distress.

## 2015-01-12 ENCOUNTER — Encounter (HOSPITAL_COMMUNITY): Payer: Self-pay | Admitting: Emergency Medicine

## 2015-01-12 ENCOUNTER — Emergency Department (HOSPITAL_COMMUNITY)
Admission: EM | Admit: 2015-01-12 | Discharge: 2015-01-12 | Disposition: A | Discharge status: Home / Self Care | Payer: Medicare Other | Source: Home / Self Care | Attending: Family Medicine | Admitting: Family Medicine

## 2015-01-12 DIAGNOSIS — H6121 Impacted cerumen, right ear: Secondary | ICD-10-CM | POA: Diagnosis not present

## 2015-01-12 NOTE — ED Notes (Signed)
C/o cerumen impact of the right ear Denies any drainage States he tried cleaning ear himself

## 2015-01-12 NOTE — Discharge Instructions (Signed)
Thank you for coming in today. ° ° °Cerumen Impaction °A cerumen impaction is when the wax in your ear forms a plug. This plug usually causes reduced hearing. Sometimes it also causes an earache or dizziness. Removing a cerumen impaction can be difficult and painful. The wax sticks to the ear canal. The canal is sensitive and bleeds easily. If you try to remove a heavy wax buildup with a cotton tipped swab, you may push it in further. °Irrigation with water, suction, and small ear curettes may be used to clear out the wax. If the impaction is fixed to the skin in the ear canal, ear drops may be needed for a few days to loosen the wax. People who build up a lot of wax frequently can use ear wax removal products available in your local drugstore. °SEEK MEDICAL CARE IF:  °You develop an earache, increased hearing loss, or marked dizziness. °Document Released: 08/27/2004 Document Revised: 10/12/2011 Document Reviewed: 10/17/2009 °[employer]® Patient Information ©2015 [employer]. This information is not intended to replace advice given to you by your health care provider. Make sure you discuss any questions you have with your health care provider. ° °

## 2015-01-12 NOTE — ED Provider Notes (Signed)
Brian Parks is a 49 y.o. male who presents to Urgent Care today for Right ear pressure and pain and decreased hearing for 4 days. Symptoms consistent with previous episodes of cerumen impaction. Patient tried hydrogen peroxide irrigation which did not help much. . No fevers or chills nausea vomiting or diarrhea.   Past Medical History  Diagnosis Date  . Hypertension   . Hypercholesteremia   . Diabetes mellitus without complication   . Chronic back pain    Past Surgical History  Procedure Laterality Date  . Nerve stimilator     History  Substance Use Topics  . Smoking status: Never Smoker   . Smokeless tobacco: Not on file  . Alcohol Use: No   ROS as above Medications: No current facility-administered medications for this encounter.   Current Outpatient Prescriptions  Medication Sig Dispense Refill  . colesevelam (WELCHOL) 625 MG tablet Take 1,875 mg by mouth 2 (two) times daily with a meal.    . enoxaparin (LOVENOX) 120 MG/0.8ML injection Inject 0.8 mL (120 mg total) into the skin every 12 (twelve) hours. 5 Syringe 0  . fenofibrate 160 MG tablet Take 160 mg by mouth at bedtime.    Marland Kitchen glimepiride (AMARYL) 2 MG tablet Take 2 mg by mouth 2 (two) times daily.    Marland Kitchen guaiFENesin-codeine (GUIATUSS AC) 100-10 MG/5ML syrup Take 10 mLs by mouth 4 (four) times daily as needed for cough. 120 mL 0  . hydrochlorothiazide (MICROZIDE) 12.5 MG capsule Take 1 capsule (12.5 mg total) by mouth daily. 30 capsule 0  . insulin lispro protamine-lispro (HUMALOG 50/50) (50-50) 100 UNIT/ML SUSP injection Inject 20 Units into the skin 3 (three) times daily.    Marland Kitchen ipratropium (ATROVENT) 0.06 % nasal spray Place 2 sprays into both nostrils 4 (four) times daily. 15 mL 12  . linagliptin (TRADJENTA) 5 MG TABS tablet Take 5 mg by mouth daily.    . metFORMIN (GLUCOPHAGE) 1000 MG tablet Take 1,000 mg by mouth 2 (two) times daily with a meal.    . OMEGA-3 KRILL OIL PO Take 1 capsule by mouth every morning.    Marland Kitchen  oxyCODONE (OXYCONTIN) 40 MG 12 hr tablet Take 1 tablet (40 mg total) by mouth every 12 (twelve) hours. 20 tablet 0  . oxyCODONE 10 MG TABS Take 1 tablet (10 mg total) by mouth every 6 (six) hours as needed for pain. 20 tablet 0  . warfarin (COUMADIN) 5 MG tablet Take 2 tablets (10 mg total) by mouth every evening. 60 tablet 0   Allergies  Allergen Reactions  . Lipitor [Atorvastatin] Other (See Comments)    Fatigue  . Losartan Nausea And Vomiting, Other (See Comments) and Cough    Dizziness   . Lyrica [Pregabalin] Other (See Comments)    blindness  . Prednisone Swelling    Legs swell     Exam:  BP 131/90 mmHg  Pulse 91  Temp(Src) 98.4 F (36.9 C) (Oral)  Resp 16  SpO2 98% Gen: Well NAD HEENT: EOMI,  MMM right cerumen impaction relieved after irrigation.  Exts: Brisk capillary refill, warm and well perfused.   No results found for this or any previous visit (from the past 24 hour(s)). No results found.  Assessment and Plan: 49 y.o. male with cerumen impaction alleviated. Return as needed.  Discussed warning signs or symptoms. Please see discharge instructions. Patient expresses understanding.     Rodolph Bong, MD 01/12/15 [phone]

## 2015-10-24 DIAGNOSIS — M5030 Other cervical disc degeneration, unspecified cervical region: Secondary | ICD-10-CM | POA: Insufficient documentation

## 2015-10-24 DIAGNOSIS — G8929 Other chronic pain: Secondary | ICD-10-CM | POA: Insufficient documentation

## 2015-10-24 DIAGNOSIS — Z9689 Presence of other specified functional implants: Secondary | ICD-10-CM | POA: Insufficient documentation

## 2015-10-24 DIAGNOSIS — M1990 Unspecified osteoarthritis, unspecified site: Secondary | ICD-10-CM | POA: Insufficient documentation

## 2015-10-24 DIAGNOSIS — M47816 Spondylosis without myelopathy or radiculopathy, lumbar region: Secondary | ICD-10-CM | POA: Insufficient documentation

## 2016-05-27 DIAGNOSIS — L719 Rosacea, unspecified: Secondary | ICD-10-CM | POA: Insufficient documentation

## 2016-11-25 ENCOUNTER — Other Ambulatory Visit: Payer: Self-pay | Admitting: Orthopaedic Surgery

## 2016-11-25 DIAGNOSIS — M5080 Other cervical disc disorders, unspecified cervical region: Secondary | ICD-10-CM

## 2016-11-25 DIAGNOSIS — G894 Chronic pain syndrome: Secondary | ICD-10-CM

## 2016-12-14 ENCOUNTER — Ambulatory Visit
Admission: RE | Admit: 2016-12-14 | Discharge: 2016-12-14 | Disposition: A | Discharge status: Home / Self Care | Payer: Worker's Compensation | Source: Ambulatory Visit | Attending: Orthopaedic Surgery | Admitting: Orthopaedic Surgery

## 2016-12-14 DIAGNOSIS — G894 Chronic pain syndrome: Secondary | ICD-10-CM

## 2016-12-14 DIAGNOSIS — M5080 Other cervical disc disorders, unspecified cervical region: Secondary | ICD-10-CM

## 2016-12-14 MED ORDER — ONDANSETRON HCL 4 MG/2ML IJ SOLN: 4.0000 mg | Freq: Four times a day (QID) | INTRAMUSCULAR | Status: DC | PRN

## 2016-12-14 MED ORDER — DIAZEPAM 5 MG PO TABS
10.0000 mg | ORAL_TABLET | Freq: Once | ORAL | Status: AC
  Administered 2016-12-14: 10 mg via ORAL

## 2016-12-14 MED ORDER — IOPAMIDOL (ISOVUE-M 300) INJECTION 61%
10.0000 mL | Freq: Once | INTRAMUSCULAR | Status: AC | PRN
  Administered 2016-12-14: 10 mL via INTRATHECAL

## 2016-12-14 NOTE — Discharge Instructions (Signed)

## 2017-12-15 DIAGNOSIS — J302 Other seasonal allergic rhinitis: Secondary | ICD-10-CM | POA: Insufficient documentation

## 2018-09-14 ENCOUNTER — Ambulatory Visit
Admission: RE | Admit: 2018-09-14 | Discharge: 2018-09-14 | Disposition: A | Discharge status: Home / Self Care | Payer: Medicare Other | Source: Ambulatory Visit | Attending: Family Medicine | Admitting: Family Medicine

## 2018-09-14 ENCOUNTER — Other Ambulatory Visit: Payer: Self-pay | Admitting: Family Medicine

## 2018-09-14 DIAGNOSIS — M79642 Pain in left hand: Secondary | ICD-10-CM

## 2018-09-14 DIAGNOSIS — E1140 Type 2 diabetes mellitus with diabetic neuropathy, unspecified: Secondary | ICD-10-CM | POA: Insufficient documentation

## 2018-09-14 DIAGNOSIS — E1165 Type 2 diabetes mellitus with hyperglycemia: Secondary | ICD-10-CM | POA: Insufficient documentation

## 2018-12-16 ENCOUNTER — Other Ambulatory Visit: Payer: Self-pay | Admitting: Orthopaedic Surgery

## 2018-12-16 DIAGNOSIS — M5080 Other cervical disc disorders, unspecified cervical region: Secondary | ICD-10-CM

## 2018-12-16 DIAGNOSIS — M4716 Other spondylosis with myelopathy, lumbar region: Secondary | ICD-10-CM

## 2018-12-22 ENCOUNTER — Telehealth: Payer: Self-pay | Admitting: Nurse Practitioner

## 2018-12-22 NOTE — Telephone Encounter (Signed)
Phone call to patient to verify medication list and allergies for myelogram procedure. Pt aware he will not need to hold any medications for this procedure. Pre and post procedure instructions reviewed with pt. Pt verbalized understanding.  

## 2019-01-20 ENCOUNTER — Ambulatory Visit
Admission: RE | Admit: 2019-01-20 | Discharge: 2019-01-20 | Disposition: A | Discharge status: Home / Self Care | Payer: Worker's Compensation | Source: Ambulatory Visit | Attending: Orthopaedic Surgery | Admitting: Orthopaedic Surgery

## 2019-01-20 ENCOUNTER — Ambulatory Visit
Admission: RE | Admit: 2019-01-20 | Discharge: 2019-01-20 | Disposition: A | Discharge status: Home / Self Care | Payer: Medicare Other | Source: Ambulatory Visit | Attending: Orthopaedic Surgery | Admitting: Orthopaedic Surgery

## 2019-01-20 ENCOUNTER — Other Ambulatory Visit: Payer: Self-pay

## 2019-01-20 DIAGNOSIS — M4716 Other spondylosis with myelopathy, lumbar region: Secondary | ICD-10-CM

## 2019-01-20 DIAGNOSIS — M5080 Other cervical disc disorders, unspecified cervical region: Secondary | ICD-10-CM

## 2019-01-20 MED ORDER — IOPAMIDOL (ISOVUE-M 300) INJECTION 61%
10.0000 mL | Freq: Once | INTRAMUSCULAR | Status: AC
  Administered 2019-01-20: 10 mL via INTRATHECAL

## 2019-01-20 MED ORDER — DIAZEPAM 5 MG PO TABS
10.0000 mg | ORAL_TABLET | Freq: Once | ORAL | Status: AC
  Administered 2019-01-20: 10 mg via ORAL

## 2019-01-20 NOTE — Discharge Instructions (Signed)

## 2019-09-21 ENCOUNTER — Ambulatory Visit (HOSPITAL_COMMUNITY)
Admission: EM | Admit: 2019-09-21 | Discharge: 2019-09-21 | Disposition: A | Discharge status: Home / Self Care | Payer: Medicare Other | Attending: Internal Medicine | Admitting: Internal Medicine

## 2019-09-21 ENCOUNTER — Encounter (HOSPITAL_COMMUNITY): Payer: Self-pay

## 2019-09-21 ENCOUNTER — Other Ambulatory Visit: Payer: Self-pay

## 2019-09-21 DIAGNOSIS — H6122 Impacted cerumen, left ear: Secondary | ICD-10-CM

## 2019-09-21 DIAGNOSIS — H9202 Otalgia, left ear: Secondary | ICD-10-CM

## 2019-09-21 MED ORDER — CARBAMIDE PEROXIDE 6.5 % OT SOLN: 5.0000 [drp] | Freq: Two times a day (BID) | OTIC | 0 refills | Status: AC

## 2019-09-21 NOTE — ED Provider Notes (Addendum)
[facility]    CSN: [id number] Arrival date & time: 09/21/19  1115      History   Chief Complaint Chief Complaint  Patient presents with  . Ear Wax Removal    Left    HPI Brian Parks is a 54 y.o. male comes to urgent care with left ear pain.  He has recurrent earwax impaction and feels that that is the reason why his left ear hurts at this time.  He has tried some remedies at home with no improvement and he hopes to get earwax removal..   HPI  Past Medical History:  Diagnosis Date  . Chronic back pain   . Diabetes mellitus without complication (HCC)   . Hypercholesteremia   . Hypertension     Patient Active Problem List   Diagnosis Date Noted  . Type II or unspecified type diabetes mellitus with unspecified complication, uncontrolled 03/16/2013  . Elevated blood pressure 03/16/2013  . Normocytic anemia 03/16/2013  . Unspecified constipation 03/16/2013  . Narcotic dependence (HCC) 03/16/2013  . Pulmonary emboli (HCC) 03/13/2013  . DVT (deep venous thrombosis) (HCC) 03/13/2013    Past Surgical History:  Procedure Laterality Date  . nerve stimilator         Home Medications    Prior to Admission medications   Medication Sig Start Date End Date Taking? Authorizing Provider  aspirin 81 MG chewable tablet Chew 81 mg by mouth daily.    [provider]  canagliflozin (INVOKANA) 300 MG TABS tablet TAKE ONE TABLET BY MOUTH ONCE DAILY 12/14/16   [provider]  carbamide peroxide (DEBROX) 6.5 % OTIC solution Place 5 drops into both ears 2 (two) times daily for 3 days. To be used as needed for ear wax impaction 09/21/19 09/24/19  Malaka Ruffner, Britta Mccreedy, MD  celecoxib (CELEBREX) 200 MG capsule Take 200 mg by mouth 2 (two) times daily.    [provider]  fenofibrate 160 MG tablet Take 160 mg by mouth at bedtime.    [provider]  insulin lispro protamine-lispro (HUMALOG 50/50) (50-50) 100 UNIT/ML SUSP injection Inject 20  Units into the skin 3 (three) times daily.    [provider]  ipratropium (ATROVENT) 0.06 % nasal spray Place 2 sprays into both nostrils 4 (four) times daily. 08/26/13   Reuben Likes, MD  linagliptin (TRADJENTA) 5 MG TABS tablet Take 5 mg by mouth daily.    [provider]  metFORMIN (GLUCOPHAGE) 1000 MG tablet Take 1,000 mg by mouth 2 (two) times daily with a meal.    [provider]  OMEGA-3 KRILL OIL PO Take 1 capsule by mouth every morning.    [provider]  oxyCODONE 10 MG TABS Take 1 tablet (10 mg total) by mouth every 6 (six) hours as needed for pain. 03/19/13   Renae Fickle, MD  pregabalin (LYRICA) 75 MG capsule Take 75 mg by mouth 2 (two) times daily.    [provider]    Family History Family History  Problem Relation Age of Onset  . Diabetes Mother   . Heart failure Mother     Social History Social History   Tobacco Use  . Smoking status: Never Smoker  . Smokeless tobacco: Never Used  Substance Use Topics  . Alcohol use: Not Currently    Comment: occ takes a shot of tequila to "help his sugar"  . Drug use: No     Allergies   Lisinopril, Losartan, and Lipitor [atorvastatin]  Review of Systems Review of Systems  Constitutional: Negative for activity change, chills and fever.  HENT: Positive for ear pain. Negative for congestion, ear discharge and sore throat.   Respiratory: Negative for cough, shortness of breath and wheezing.      Physical Exam Triage Vital Signs ED Triage Vitals  Enc Vitals Group     BP 09/21/19 1128 126/73     Pulse Rate 09/21/19 1128 92     Resp 09/21/19 1128 16     Temp 09/21/19 1128 98.5 F (36.9 C)     Temp Source 09/21/19 1128 Oral     SpO2 09/21/19 1128 98 %     Weight --      Height --      Head Circumference --      Peak Flow --      Pain Score 09/21/19 1126 4     Pain Loc --      Pain Edu? --      Excl. in [place]? --    No data found.  Updated Vital Signs BP 126/73  (BP Location: Right Arm)   Pulse 92   Temp 98.5 F (36.9 C) (Oral)   Resp 16   SpO2 98%   Visual Acuity Right Eye Distance:   Left Eye Distance:   Bilateral Distance:    Right Eye Near:   Left Eye Near:    Bilateral Near:     Physical Exam Constitutional:      Appearance: Normal appearance.  HENT:     Right Ear: Tympanic membrane normal.     Left Ear: Tympanic membrane normal. There is impacted cerumen.     Ears:     Comments: Left TM partially visible. No erythema identified. No perforation noted Cardiovascular:     Rate and Rhythm: Normal rate and regular rhythm.     Pulses: Normal pulses.     Heart sounds: Normal heart sounds.      UC Treatments / Results  Labs (all labs ordered are listed, but only abnormal results are displayed) Labs Reviewed - No data to display  EKG   Radiology No results found.  Procedures Procedures (including critical care time)  Medications Ordered in UC Medications - No data to display  Initial Impression / Assessment and Plan / UC Course  I have reviewed the triage vital signs and the nursing notes.  Pertinent labs & imaging results that were available during my care of the patient were reviewed by me and considered in my medical decision making (see chart for details).     1.  Earwax impaction in the left ear: Ear irrigation Debrox for future use Tylenol as needed for pain Return precautions given.  Left eardrum was fully visible after left ear irrigation.  No perforation of the tympanic membrane after the procedure.  Patient tolerated the procedure well with no pain. Final Clinical Impressions(s) / UC Diagnoses   Final diagnoses:  Impacted cerumen of left ear   Discharge Instructions   None    ED Prescriptions    Medication Sig Dispense Auth. Provider   carbamide peroxide (DEBROX) 6.5 % OTIC solution Place 5 drops into both ears 2 (two) times daily for 3 days. To be used as needed for ear wax impaction 15 mL  Mariha Sleeper, Myrene Galas, MD     PDMP not reviewed this encounter.   Chase Picket, MD 09/21/19 1148    Chase Picket, MD 09/21/19 1201    Chase Picket, MD  09/21/19 1204  

## 2019-09-21 NOTE — ED Triage Notes (Signed)
Patient presents to Urgent Care with complaints of left earwax buildup since 4-5 days ago. Patient reports he tried to get it out at home yesterday and does not think he got it all out; pt states this happens every year, hearing is not affected.

## 2019-11-19 IMAGING — XA DG MYELOGRAPHY LUMBAR INJ MULTI REGION
6 of 18 series · 6 of 18 positions shown · non-contrast
Comparison: Cervical and lumbar CT myelogram dated December 14, 2016.

CLINICAL DATA: Chronic neck pain. Left arm numbness when looking
down or to the right. Right-sided low back pain radiating into the
right hip and lateral thigh.
TECHNIQUE: Contiguous axial images were obtained through the cervical and
lumbar spine after the intrathecal infusion of contrast. Coronal and
sagittal reconstructions were obtained of the axial image sets.

[Series 1: w lumbar spine lat · 0.15mm/px · 1 of 1 slices shown]
[im 1/1]
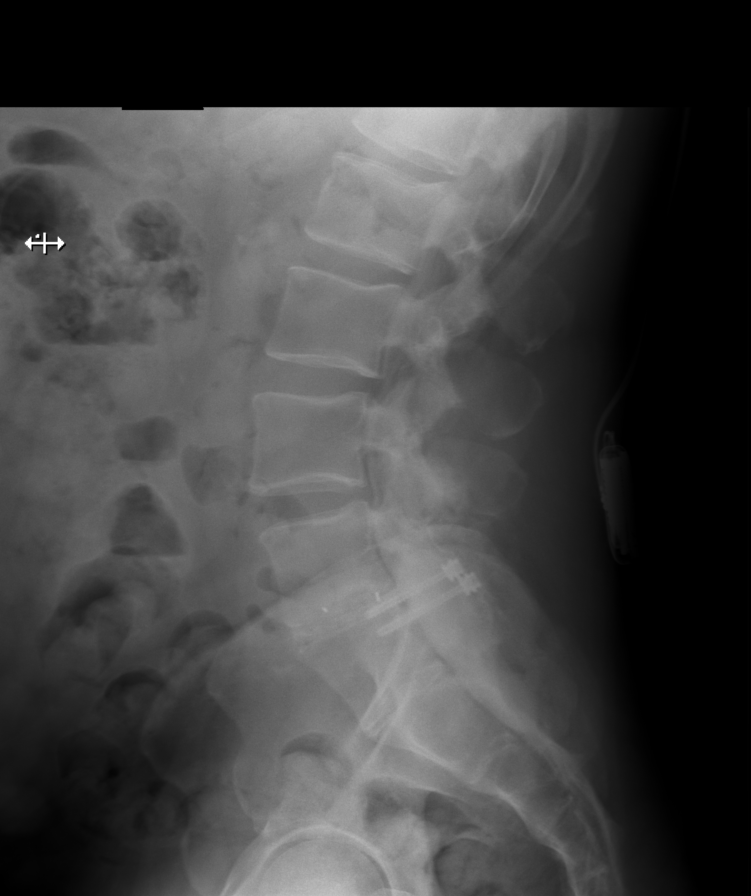

[Series 2: w lumbar spine flexion · 0.15mm/px · 1 of 1 slices shown]
[im 1/1]
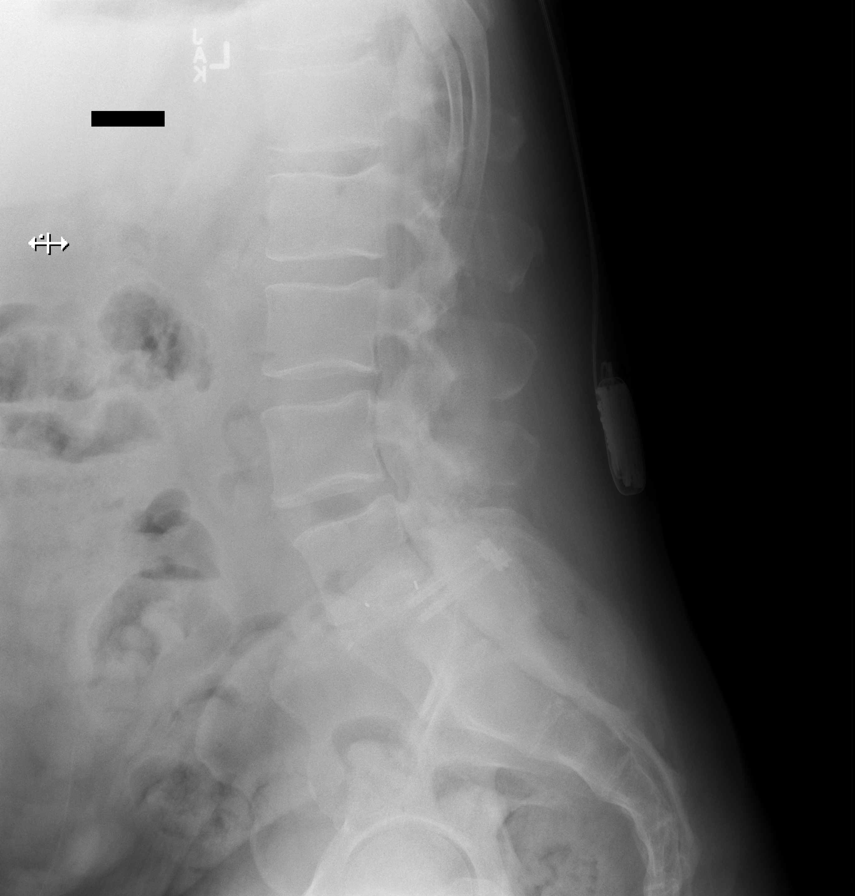

[Series 2: vasc standard · 1 of 1 slices shown (1 of 3)]
[im 1/1]
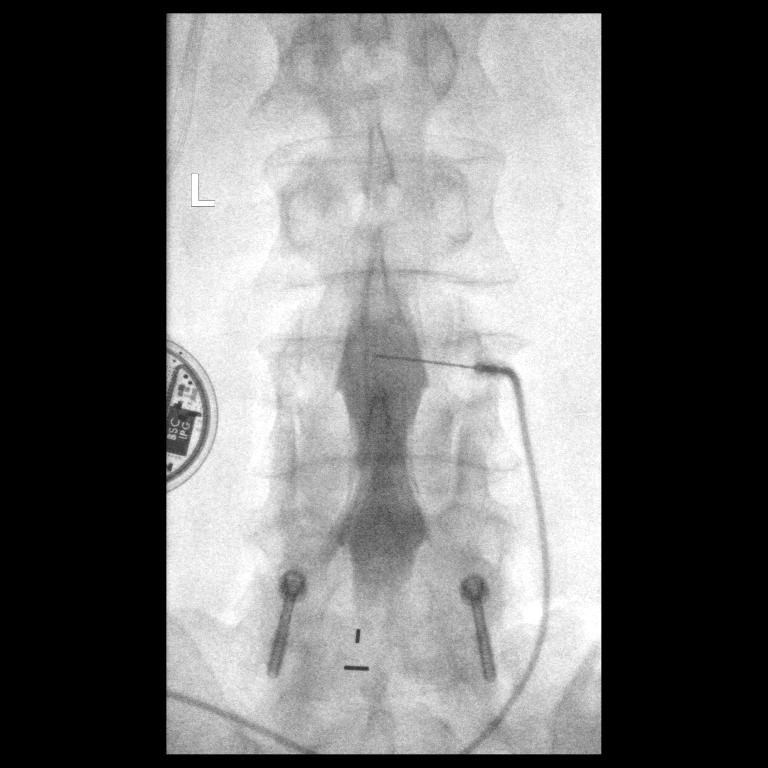

[Series 3: w lumbar spine extension · 0.15mm/px · 1 of 1 slices shown]
[im 1/1]
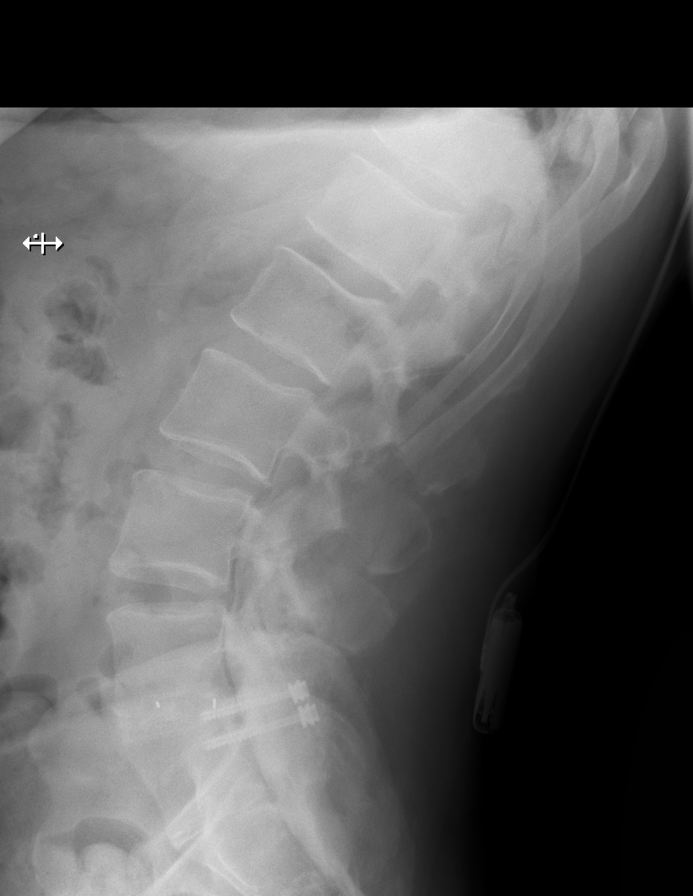

[Series 3: vasc standard · 1 of 1 slices shown (2 of 3)]
[im 1/1]
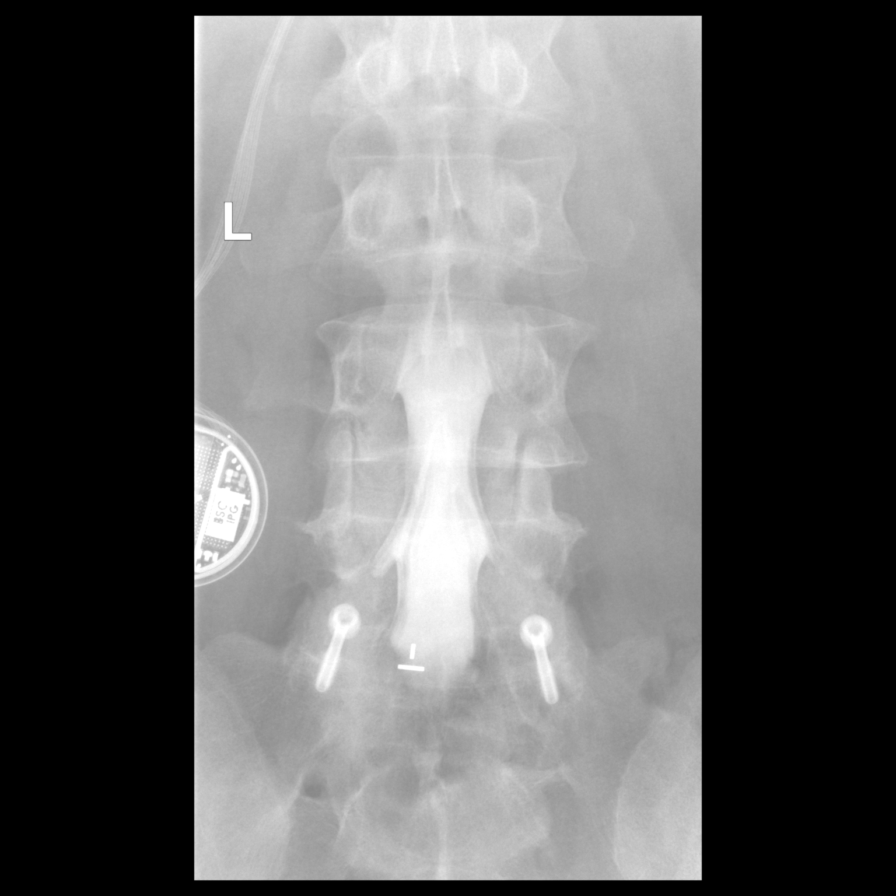

[Series 4: vasc standard · 1 of 1 slices shown (3 of 3)]
[im 1/1]
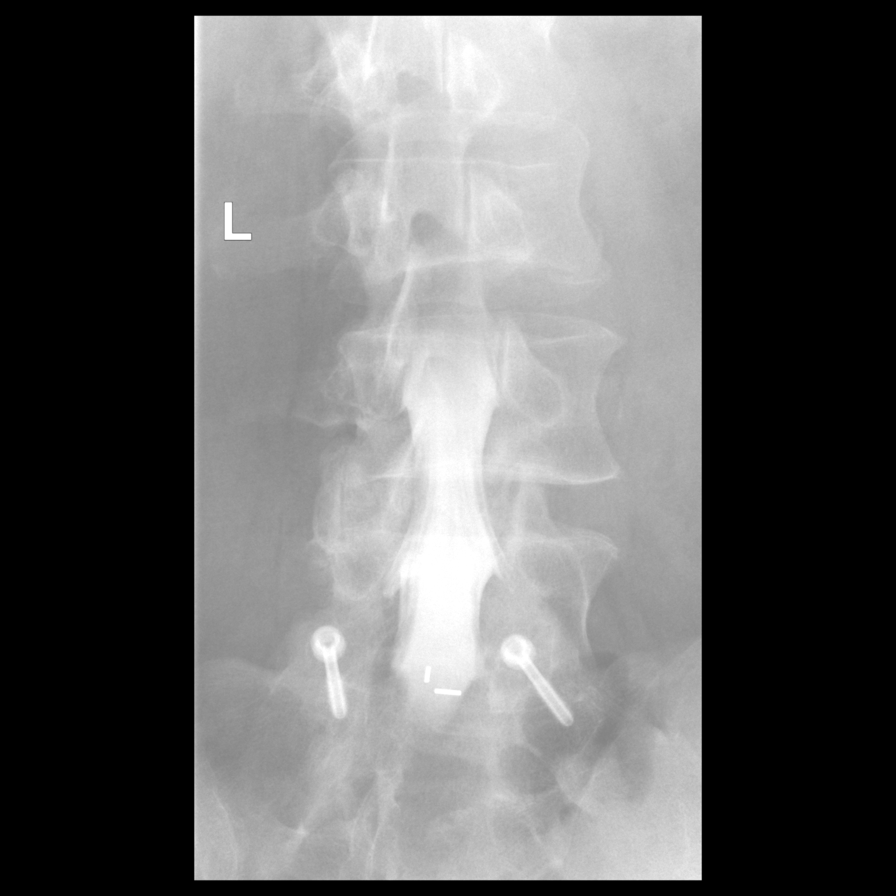

[6 of 18 positions shown; findings below may reference images not displayed]

EXAM:
LUMBAR PUNCTURE FOR CERVICAL AND LUMBAR MYELOGRAM

CERVICAL AND LUMBAR MYELOGRAM

CT CERVICAL MYELOGRAM

CT LUMBAR MYELOGRAM

FLUOROSCOPY TIME:  Radiation Exposure Index (as provided by the
fluoroscopic device): 37.6 mGy

Fluoroscopy Time:  2 minutes, 27 seconds.

Number of Acquired Images:  17

PROCEDURE:
After thorough discussion of risks and benefits of the procedure
including bleeding, infection, injury to nerves, blood vessels,
adjacent structures as well as headache and CSF leak, written and
oral informed consent was obtained. Consent was obtained by Dr.
Jazlyn Lis.

Patient was positioned prone on the fluoroscopy table. Local
anesthesia was provided with 1% lidocaine without epinephrine after
prepped and draped in the usual sterile fashion. Puncture was
performed at L3-L4 using a 3 1/2 inch 22-gauge spinal needle via
right interlaminar approach. Using a single pass through the dura,
the needle was placed within the thecal sac, with return of clear
CSF. 10 mL Isovue [id number] was injected into the thecal sac, with
normal opacification of the nerve roots and cauda equina consistent
with free flow within the subarachnoid space. The patient was then
moved to the trendelenburg position and contrast flowed into the
cervical spine region.

I personally performed the lumbar puncture and administered the
intrathecal contrast. I also personally supervised acquisition of
the myelogram images.
FINDINGS: CERVICAL AND LUMBAR MYELOGRAM FINDINGS:

Cervical spine: Normal alignment. Small ventral extradural defects
at C5-C6 and C6-C7, similar to prior study. The lower cervical
spinal canal is not well visualized on lateral view. No nerve root
effacement.

Lumbar spine: Normal alignment prone in standing. New 1-2 mm
anterolisthesis at L4-L5 with flexion. Prior L5-S1 fusion. Unchanged
shallow ventral extradural defects at L3-L4 and L4-L5. No spinal
canal stenosis or nerve root effacement.

CT CERVICAL MYELOGRAM FINDINGS:

Alignment: Physiologic.

Skull base and vertebrae: No acute fracture or other focal
pathologic process.

Cord: Normal bulk and morphology.

Soft tissues and upper chest: Negative.

Disc levels:

C2-C3: Unchanged mild disc bulging and mild left facet arthropathy.
No stenosis.

C3-C4: Negative disc. Unchanged mild right neuroforaminal stenosis
due to right uncovertebral hypertrophy. No spinal canal or left
neuroforaminal stenosis.

C4-C5: Unchanged mild disc bulging with superimposed small calcified
right paracentral disc protrusion. Unchanged borderline mild spinal
canal stenosis. No neuroforaminal stenosis.

C5-C6: Progressive disc bulging and uncovertebral hypertrophy with
worsening now moderate spinal canal stenosis. Unchanged severe
bilateral neuroforaminal stenosis.

C6-C7: Unchanged posterior disc osteophyte complex and moderate
bilateral uncovertebral hypertrophy. Moderate spinal canal and
severe bilateral neuroforaminal stenosis, slightly progressed.

C7-T1: Negative disc. Mild bilateral facet arthropathy. No stenosis.

CT LUMBAR MYELOGRAM FINDINGS:

Segmentation: Similar appearing transitional anatomy with
hypoplastic ribs at L1 and partial lumbarization of S1.

Alignment: Physiologic.

Vertebrae: Prior L5-S1 fusion with interbody graft and bilateral
facet joint screws. Solid arthrodesis again noted. No evidence of
hardware failure or loosening. No acute fracture or other focal
pathologic process.

Conus medullaris and cauda equina: Conus extends to the L2 level.
Conus and cauda equina appear normal.

Paraspinal and other soft tissues: Partially visualized spinal cord
stimulator. New ectasia of the infrarenal abdominal aorta measuring
up to 2.7 cm.

Disc levels:

T12-L1 to L2-L3:  Negative.

L3-L4:  Unchanged minimal disc bulging.  No stenosis.

L4-L5: Unchanged minimal disc bulging and moderate to severe
bilateral facet arthropathy. Unchanged minimal bilateral lateral
recess stenosis. Unchanged mild left neuroforaminal stenosis. No
spinal canal or right neuroforaminal stenosis.

L5-S1:  Prior posterior decompression and fusion.  No stenosis.
IMPRESSION: Cervical spine:

1. Mildly progressive spondylosis at C5-C6 with worsening now
moderate spinal canal stenosis. Unchanged severe bilateral
neuroforaminal stenosis.
2. Slightly progressed moderate spinal canal and severe bilateral
neuroforaminal stenosis at C6-C7.

Lumbar spine:

1. Unchanged advanced facet arthropathy at L4-L5 with new 1-2 mm
anterolisthesis during flexion. Unchanged mild lateral recess and
left neuroforaminal stenosis at this level.
2. New ectatic infrarenal abdominal aorta measuring up to 2.7 cm.
Recommend followup by ultrasound in 5 years. This recommendation
follows ACR consensus guidelines: White Paper of the ACR Incidental
Aortic aneurysm NOS (9PDYU-MYK.O)

## 2019-12-25 DIAGNOSIS — Z86718 Personal history of other venous thrombosis and embolism: Secondary | ICD-10-CM | POA: Insufficient documentation

## 2019-12-25 DIAGNOSIS — Z86711 Personal history of pulmonary embolism: Secondary | ICD-10-CM | POA: Insufficient documentation

## 2019-12-26 DIAGNOSIS — E781 Pure hyperglyceridemia: Secondary | ICD-10-CM | POA: Insufficient documentation

## 2020-04-18 ENCOUNTER — Other Ambulatory Visit: Payer: Self-pay

## 2020-04-18 ENCOUNTER — Ambulatory Visit (HOSPITAL_COMMUNITY)
Admission: EM | Admit: 2020-04-18 | Discharge: 2020-04-18 | Disposition: A | Discharge status: Home / Self Care | Payer: Medicare Other | Attending: Family Medicine | Admitting: Family Medicine

## 2020-04-18 ENCOUNTER — Encounter (HOSPITAL_COMMUNITY): Payer: Self-pay | Admitting: Emergency Medicine

## 2020-04-18 DIAGNOSIS — H6121 Impacted cerumen, right ear: Secondary | ICD-10-CM

## 2020-04-18 NOTE — ED Triage Notes (Signed)
Pt c/o right ear fullness. Pt states he also has some build up in the left ear.

## 2020-04-18 NOTE — Discharge Instructions (Addendum)
We cleaned the ear out today.  Follow up as needed for continued or worsening symptoms

## 2020-04-19 NOTE — ED Provider Notes (Signed)
[facility]    CSN: [id number] Arrival date & time: 04/18/20  0805      History   Chief Complaint Chief Complaint  Patient presents with  . Ear Fullness    HPI DESMUND ELMAN is a 54 y.o. male.   Pt is a 54 year old male that presents today for ear fullness. This is the right ear. Hx of cerumen impaction. Tried home remedies without much improvement. No fever, pain.      Past Medical History:  Diagnosis Date  . Chronic back pain   . Diabetes mellitus without complication (HCC)   . Hypercholesteremia   . Hypertension     Patient Active Problem List   Diagnosis Date Noted  . Type II or unspecified type diabetes mellitus with unspecified complication, uncontrolled 03/16/2013  . Elevated blood pressure 03/16/2013  . Normocytic anemia 03/16/2013  . Unspecified constipation 03/16/2013  . Narcotic dependence (HCC) 03/16/2013  . Pulmonary emboli (HCC) 03/13/2013  . DVT (deep venous thrombosis) (HCC) 03/13/2013    Past Surgical History:  Procedure Laterality Date  . nerve stimilator         Home Medications    Prior to Admission medications   Medication Sig Start Date End Date Taking? Authorizing Provider  aspirin 81 MG chewable tablet Chew 81 mg by mouth daily.   Yes [provider]  canagliflozin (INVOKANA) 300 MG TABS tablet TAKE ONE TABLET BY MOUTH ONCE DAILY 12/14/16  Yes [provider]  celecoxib (CELEBREX) 200 MG capsule Take 200 mg by mouth 2 (two) times daily.   Yes [provider]  fenofibrate 160 MG tablet Take 160 mg by mouth at bedtime.   Yes [provider]  insulin lispro protamine-lispro (HUMALOG 50/50) (50-50) 100 UNIT/ML SUSP injection Inject 20 Units into the skin 3 (three) times daily.   Yes [provider]  ipratropium (ATROVENT) 0.06 % nasal spray Place 2 sprays into both nostrils 4 (four) times daily. 08/26/13  Yes Reuben Likes, MD  linagliptin (TRADJENTA) 5 MG TABS tablet Take 5 mg  by mouth daily.   Yes [provider]  metFORMIN (GLUCOPHAGE) 1000 MG tablet Take 1,000 mg by mouth 2 (two) times daily with a meal.   Yes [provider]  OMEGA-3 KRILL OIL PO Take 1 capsule by mouth every morning.   Yes [provider]  oxyCODONE 10 MG TABS Take 1 tablet (10 mg total) by mouth every 6 (six) hours as needed for pain. 03/19/13  Yes Short, Thea Silversmith, MD  pregabalin (LYRICA) 75 MG capsule Take 75 mg by mouth 2 (two) times daily.   Yes [provider]    Family History Family History  Problem Relation Age of Onset  . Diabetes Mother   . Heart failure Mother     Social History Social History   Tobacco Use  . Smoking status: Never Smoker  . Smokeless tobacco: Never Used  Vaping Use  . Vaping Use: Never used  Substance Use Topics  . Alcohol use: Not Currently    Comment: occ takes a shot of tequila to "help his sugar"  . Drug use: No     Allergies   Lisinopril, Losartan, and Lipitor [atorvastatin]   Review of Systems Review of Systems   Physical Exam Triage Vital Signs ED Triage Vitals  Enc Vitals Group     BP 04/18/20 0823 (!) 147/72     Pulse Rate 04/18/20 0823 99     Resp 04/18/20 0823 15  Temp 04/18/20 0823 98.2 F (36.8 C)     Temp Source 04/18/20 0823 Oral     SpO2 04/18/20 0823 100 %     Weight --      Height --      Head Circumference --      Peak Flow --      Pain Score 04/18/20 0821 0     Pain Loc --      Pain Edu? --      Excl. in [place]? --    No data found.  Updated Vital Signs BP (!) 147/72 (BP Location: Left Arm)   Pulse 99   Temp 98.2 F (36.8 C) (Oral)   Resp 15   SpO2 100%   Visual Acuity Right Eye Distance:   Left Eye Distance:   Bilateral Distance:    Right Eye Near:   Left Eye Near:    Bilateral Near:     Physical Exam Vitals and nursing note reviewed.  Constitutional:      Appearance: Normal appearance.  HENT:     Head: Normocephalic and atraumatic.     Left Ear: There  is impacted cerumen.     Nose: Nose normal.  Eyes:     Conjunctiva/sclera: Conjunctivae normal.  Pulmonary:     Effort: Pulmonary effort is normal.  Musculoskeletal:        General: Normal range of motion.     Cervical back: Normal range of motion.  Skin:    General: Skin is warm and dry.  Neurological:     Mental Status: He is alert.  Psychiatric:        Mood and Affect: Mood normal.      UC Treatments / Results  Labs (all labs ordered are listed, but only abnormal results are displayed) Labs Reviewed - No data to display  EKG   Radiology No results found.  Procedures Procedures (including critical care time)  Medications Ordered in UC Medications - No data to display  Initial Impression / Assessment and Plan / UC Course  I have reviewed the triage vital signs and the nursing notes.  Pertinent labs & imaging results that were available during my care of the patient were reviewed by me and considered in my medical decision making (see chart for details).     Cerumen impaction.  Cleaned the ear out here with ear wash  Pt feeling better.  Follow up as needed for continued or worsening symptoms   Final Clinical Impressions(s) / UC Diagnoses   Final diagnoses:  Impacted cerumen of right ear     Discharge Instructions     We cleaned the ear out today.  Follow up as needed for continued or worsening symptoms     ED Prescriptions    None     PDMP not reviewed this encounter.   Dahlia Byes A, NP 04/19/20 1222

## 2020-09-19 DIAGNOSIS — R0602 Shortness of breath: Secondary | ICD-10-CM | POA: Insufficient documentation

## 2020-11-11 ENCOUNTER — Other Ambulatory Visit: Payer: Self-pay

## 2020-11-11 ENCOUNTER — Ambulatory Visit (HOSPITAL_COMMUNITY)
Admission: EM | Admit: 2020-11-11 | Discharge: 2020-11-11 | Disposition: A | Discharge status: Home / Self Care | Payer: Medicare Other | Attending: Family Medicine | Admitting: Family Medicine

## 2020-11-11 ENCOUNTER — Encounter (HOSPITAL_COMMUNITY): Payer: Self-pay

## 2020-11-11 DIAGNOSIS — H6983 Other specified disorders of Eustachian tube, bilateral: Secondary | ICD-10-CM

## 2020-11-11 DIAGNOSIS — H6121 Impacted cerumen, right ear: Secondary | ICD-10-CM | POA: Diagnosis not present

## 2020-11-11 DIAGNOSIS — H938X3 Other specified disorders of ear, bilateral: Secondary | ICD-10-CM | POA: Diagnosis not present

## 2020-11-11 MED ORDER — PREDNISONE 20 MG PO TABS: 40.0000 mg | ORAL_TABLET | Freq: Every day | ORAL | 0 refills | Status: AC

## 2020-11-11 NOTE — Discharge Instructions (Signed)
Continue taking your allergy medicine and nasal spray.

## 2020-11-11 NOTE — ED Triage Notes (Signed)
Pt in with c/o ear wax impaction that he cannot get out in his left ear

## 2020-11-11 NOTE — ED Provider Notes (Signed)
[facility]   [id number] 11/11/20 Arrival Time: 6213  ASSESSMENT & PLAN:  1. Ear pressure, bilateral   2. Impacted cerumen of right ear   3. Eustachian tube dysfunction, bilateral    No signs of ear infection. Curette used to alleviate cerumen impaction of R ear. No complications.  Begin: Meds ordered this encounter  Medications  . predniSONE (DELTASONE) 20 MG tablet    Sig: Take 2 tablets (40 mg total) by mouth daily.    Dispense:  10 tablet    Refill:  0     Follow-up Information    [facility].   Why: If worsening or failing to improve as anticipated. Contact information: [address] [place] [address] [phone]               OTC symptom care as needed. Ensure adequate fluid intake and rest. May f/u with PCP or here as needed.  Reviewed expectations re: course of current medical issues. Questions answered. Outlined signs and symptoms indicating need for more acute intervention. Patient verbalized understanding. After Visit Summary given.   SUBJECTIVE: History from: patient.  Brian Parks is a 55 y.o. male who presents with complaint of bilateral otalgia; "ear pressure"; without drainage; without bleeding. Onset gradual, over past week. H/O wax causing similar problem. Also with seasonal allergies; takes daily allergy medicaiton and nasal spray. Recent cold symptoms: none. Fever: no. Overall normal PO intake without n/v. Sick contacts: no.  Social History   Tobacco Use  Smoking Status Never Smoker  Smokeless Tobacco Never Used     OBJECTIVE:  Vitals:   11/11/20 0951 11/11/20 0954  BP: (!) 168/92 124/81  Pulse: 98   Resp: 19   SpO2: 98%      General appearance: alert; NAD3 Ear Canal: cerumen on the right TM: bilateral: slight bulging; no erythema Neck: supple without LAD Lungs: unlabored respirations, symmetrical air entry; cough: absent; no respiratory distress Skin: warm  and dry Psychological: alert and cooperative; normal mood and affect  Allergies  Allergen Reactions  . Lisinopril Cough  . Losartan Nausea And Vomiting, Other (See Comments) and Cough    Dizziness   . Lipitor [Atorvastatin] Other (See Comments)    Fatigue    Past Medical History:  Diagnosis Date  . Chronic back pain   . Diabetes mellitus without complication (HCC)   . Hypercholesteremia   . Hypertension    Family History  Problem Relation Age of Onset  . Diabetes Mother   . Heart failure Mother    Social History   Socioeconomic History  . Marital status: Single    Spouse name: Not on file  . Number of children: Not on file  . Years of education: Not on file  . Highest education level: Not on file  Occupational History  . Not on file  Tobacco Use  . Smoking status: Never Smoker  . Smokeless tobacco: Never Used  Vaping Use  . Vaping Use: Never used  Substance and Sexual Activity  . Alcohol use: Not Currently    Comment: occ takes a shot of tequila to "help his sugar"  . Drug use: No  . Sexual activity: Not on file  Other Topics Concern  . Not on file  Social History Narrative  . Not on file   Social Determinants of Health   Financial Resource Strain: Not on file  Food Insecurity: Not on file  Transportation Needs: Not on file  Physical  Activity: Not on file  Stress: Not on file  Social Connections: Not on file  Intimate Partner Violence: Not on file            Mardella Layman, MD 11/11/20 1008

## 2020-12-09 DIAGNOSIS — H6123 Impacted cerumen, bilateral: Secondary | ICD-10-CM | POA: Insufficient documentation

## 2020-12-09 DIAGNOSIS — H6982 Other specified disorders of Eustachian tube, left ear: Secondary | ICD-10-CM | POA: Insufficient documentation

## 2021-01-17 ENCOUNTER — Other Ambulatory Visit: Payer: Self-pay | Admitting: Surgery

## 2021-01-17 ENCOUNTER — Other Ambulatory Visit (HOSPITAL_COMMUNITY): Payer: Self-pay | Admitting: Surgery

## 2021-01-17 DIAGNOSIS — M47896 Other spondylosis, lumbar region: Secondary | ICD-10-CM

## 2021-01-22 ENCOUNTER — Other Ambulatory Visit: Payer: Self-pay

## 2021-01-22 ENCOUNTER — Encounter (HOSPITAL_COMMUNITY): Payer: Self-pay

## 2021-01-22 ENCOUNTER — Ambulatory Visit (HOSPITAL_COMMUNITY)
Admission: RE | Admit: 2021-01-22 | Discharge: 2021-01-22 | Disposition: A | Discharge status: Home / Self Care | Payer: Worker's Compensation | Source: Ambulatory Visit | Attending: Surgery | Admitting: Surgery

## 2021-01-22 DIAGNOSIS — M47896 Other spondylosis, lumbar region: Secondary | ICD-10-CM

## 2021-03-10 ENCOUNTER — Other Ambulatory Visit (HOSPITAL_COMMUNITY): Payer: Self-pay

## 2021-03-10 ENCOUNTER — Other Ambulatory Visit (HOSPITAL_BASED_OUTPATIENT_CLINIC_OR_DEPARTMENT_OTHER): Payer: Self-pay

## 2021-08-11 ENCOUNTER — Ambulatory Visit: Payer: Medicare Other | Admitting: Podiatry

## 2021-08-11 ENCOUNTER — Encounter: Payer: Self-pay | Admitting: Podiatry

## 2021-08-11 ENCOUNTER — Other Ambulatory Visit: Payer: Self-pay

## 2021-08-11 DIAGNOSIS — E1165 Type 2 diabetes mellitus with hyperglycemia: Secondary | ICD-10-CM | POA: Diagnosis not present

## 2021-08-11 DIAGNOSIS — E1140 Type 2 diabetes mellitus with diabetic neuropathy, unspecified: Secondary | ICD-10-CM | POA: Diagnosis not present

## 2021-08-11 NOTE — Progress Notes (Signed)
°  Subjective:  Patient ID: Brian Parks, male    DOB: 1965/09/30,   MRN: [id number]  Chief Complaint  Patient presents with   shoes    Pt interested in DM shoes -FBS: 210 a1C: 8 PCP: Arlyce Dice x Dec    56 y.o. male presents for diabetic foot check and hoping to also get fitted for some diabetic shoes. Patient relates occasional tingling in his feet primarily in the left. Also states he has a history of back issues which contribute to the pain . Denies any other pedal complaints. Denies n/v/f/c.   Past Medical History:  Diagnosis Date   Chronic back pain    Diabetes mellitus without complication (HCC)    Hypercholesteremia    Hypertension     Objective:  Physical Exam: Vascular: DP/PT pulses 2/4 bilateral. CFT <3 seconds. Normal hair growth on digits. No edema.  Skin. No lacerations or abrasions bilateral feet. Nails normal in appearance.  Musculoskeletal: MMT 5/5 bilateral lower extremities in DF, PF, Inversion and Eversion. Deceased ROM in DF of ankle joint.  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  Neurological: Sensation intact to light touch.   Assessment:   1. Uncontrolled type 2 diabetes mellitus with hyperglycemia (HCC)   2. Neuropathy due to type 2 diabetes mellitus (HCC)      Plan:  Patient was evaluated and treated and all questions answered. -Discussed and educated patient on diabetic foot care, especially with regards to the vascular, neurological and musculoskeletal systems.  -Stressed the importance of good glycemic control and the detriment of not  controlling glucose levels in relation to the foot. -Discussed supportive shoes at all times and checking feet regularly.  -Will schedule appointment for diabetic shoes.  -Answered all patient questions -Patient to return  in 3 months for at risk foot care -Patient advised to call the office if any problems or questions arise in the meantime.   Louann Sjogren, DPM

## 2021-08-19 ENCOUNTER — Ambulatory Visit: Payer: Medicare Other

## 2021-08-19 ENCOUNTER — Other Ambulatory Visit: Payer: Self-pay

## 2021-08-19 DIAGNOSIS — E1165 Type 2 diabetes mellitus with hyperglycemia: Secondary | ICD-10-CM

## 2021-08-19 NOTE — Progress Notes (Signed)
Discussed financials and explained to patient that Medicare will only cover 80% of the cost of shoes and inserts. Patient declined treatment. Plan of care to be resumed at patient's discretion. All questions answered and concerns addressed.

## 2023-08-25 ENCOUNTER — Ambulatory Visit: Payer: Medicare Other | Admitting: Podiatry

## 2023-09-14 ENCOUNTER — Ambulatory Visit: Payer: Medicare Other | Admitting: Podiatry

## 2023-09-14 ENCOUNTER — Encounter: Payer: Self-pay | Admitting: Podiatry

## 2023-09-14 DIAGNOSIS — M2042 Other hammer toe(s) (acquired), left foot: Secondary | ICD-10-CM

## 2023-09-14 DIAGNOSIS — M79674 Pain in right toe(s): Secondary | ICD-10-CM

## 2023-09-14 DIAGNOSIS — B351 Tinea unguium: Secondary | ICD-10-CM | POA: Diagnosis not present

## 2023-09-14 DIAGNOSIS — M2041 Other hammer toe(s) (acquired), right foot: Secondary | ICD-10-CM

## 2023-09-14 DIAGNOSIS — E1142 Type 2 diabetes mellitus with diabetic polyneuropathy: Secondary | ICD-10-CM

## 2023-09-14 DIAGNOSIS — M79675 Pain in left toe(s): Secondary | ICD-10-CM | POA: Diagnosis not present

## 2023-09-14 DIAGNOSIS — E119 Type 2 diabetes mellitus without complications: Secondary | ICD-10-CM

## 2023-09-14 NOTE — Progress Notes (Signed)
Subjective:  Patient ID: Brian Parks, male    DOB: 04/11/1966,  MRN: [id number]  Chief Complaint  Patient presents with   Diabetes    Patient states he is here for [facility] , and he is interested in diabetic shoes     Discussed the use of AI scribe software for clinical note transcription with the patient, who gave verbal consent to proceed.  History of Present Illness   Brian Parks is a 58 year old male with diabetes who presents with foot pain and neuropathy.  He experiences significant foot pain, particularly in the big toes and the area underneath them, described as throbbing and extending from the middle of his legs down to his feet. This pain began after his most recent back surgery in 2023, following a history of three back surgeries. He suspects that both his back issues and diabetic neuropathy contribute to the pain. He is currently taking pregabalin 150 mg twice daily, which he feels is ineffective, and has previously tried gabapentin without success. No improvement in symptoms with current medication use, and there is a lack of protective sensation and decreased light touch in the toes.  He has diabetes with a last recorded A1c of 8.3. Despite efforts to lower his blood sugar levels, the foot pain persists even when his A1c was around 7.0.  He frequently experiences ingrown toenails, particularly on the big toes, and has a history of corns. He attributes these issues to wearing rubber boots at his workplace, a [employer].  He has had injections in his neck due to additional neck issues, which he believes have exacerbated his condition.          Objective:    Physical Exam   EXTREMITIES: Feet bilaterally warm and well perfused, palpable pulses, good capillary fill time. Thick and elongated multiple toenails with dystrophy and subcutaneous debris, worst affected areas the bilateral great toes and second toes. Diffuse polyneuropathy with lack of protective sensation  and decreased light touch in the toes.  Reducible hammertoe deformity of the lesser toes bilateral      No images are attached to the encounter.    Results   Procedure: Toenail Trimming Description: Toenails were trimmed.  LABS A1c: 8.3      Assessment:   1. Diabetic peripheral neuropathy (HCC)   2. Pain due to onychomycosis of toenails of both feet   3. Hammertoe of left foot   4. Hammertoe of right foot   5. Encounter for diabetic foot exam [facility])      Plan:  Patient was evaluated and treated and all questions answered.  Assessment and Plan    Diabetic Peripheral Neuropathy Severe foot pain, throbbing in the toes, and numbness in the lower legs. A1c was 8.3 at last check, but patient reports recent lower numbers. Currently on Pregabalin 150mg  twice daily, but reports no relief. Discussed alternative treatments including Gabapentin and Qutenza patch, but patient declined. -Continue Pregabalin 150mg  twice daily. -Consider alternative treatments if pain persists.  Diabetes Mellitus Patient educated on diabetes. Discussed proper diabetic foot care and discussed risks and complications of disease. Educated patient in depth on reasons to return to the office immediately should he/she discover anything concerning or new on the feet. All questions answered. Discussed proper shoes as well.  Fitting for diabetic shoes will be scheduled with our pedorthist. Last A1c was 8.3, but patient reports recent lower numbers. -Continue current management and monitor A1c.      Discussed the etiology and  treatment options for the condition in detail with the patient. Recommended debridement of the nails today. Sharp and mechanical debridement performed of all painful and mycotic nails today. Nails debrided in length and thickness using a nail nipper to level of comfort.       Return if symptoms worsen or fail to improve.

## 2023-10-11 ENCOUNTER — Ambulatory Visit: Payer: Medicare Other | Admitting: Neurology

## 2024-03-13 ENCOUNTER — Ambulatory Visit: Payer: Medicare Other

## 2024-03-13 NOTE — Progress Notes (Signed)
 Patient has 20% coins and does not want to pay oop for shoes and inserts  Lolita Schultze Cped, CFo, CFm
